# Patient Record
Sex: Female | Born: 1955
Health system: Southern US, Community
[De-identification: ages and names within clinical notes are randomized; demographics above are authoritative.]

## PROBLEM LIST (undated history)

## (undated) DIAGNOSIS — T7840XA Allergy, unspecified, initial encounter: Secondary | ICD-10-CM

## (undated) DIAGNOSIS — M7500 Adhesive capsulitis of unspecified shoulder: Secondary | ICD-10-CM

## (undated) DIAGNOSIS — N281 Cyst of kidney, acquired: Secondary | ICD-10-CM

## (undated) DIAGNOSIS — Z973 Presence of spectacles and contact lenses: Secondary | ICD-10-CM

## (undated) DIAGNOSIS — M8580 Other specified disorders of bone density and structure, unspecified site: Secondary | ICD-10-CM

## (undated) DIAGNOSIS — N201 Calculus of ureter: Secondary | ICD-10-CM

## (undated) DIAGNOSIS — Q248 Other specified congenital malformations of heart: Secondary | ICD-10-CM

## (undated) DIAGNOSIS — M1990 Unspecified osteoarthritis, unspecified site: Secondary | ICD-10-CM

## (undated) DIAGNOSIS — R350 Frequency of micturition: Secondary | ICD-10-CM

## (undated) HISTORY — DX: Allergy, unspecified, initial encounter: T78.40XA

## (undated) HISTORY — DX: Adhesive capsulitis of unspecified shoulder: M75.00

## (undated) HISTORY — PX: CYSTECTOMY: SHX5119

## (undated) HISTORY — DX: Other specified disorders of bone density and structure, unspecified site: M85.80

## (undated) HISTORY — PX: COLONOSCOPY: SHX174

---

## 1999-02-04 ENCOUNTER — Other Ambulatory Visit: Admission: RE | Admit: 1999-02-04 | Discharge: 1999-02-04 | Payer: Self-pay | Admitting: Obstetrics and Gynecology

## 1999-12-17 ENCOUNTER — Ambulatory Visit (HOSPITAL_COMMUNITY): Admission: RE | Admit: 1999-12-17 | Discharge: 1999-12-17 | Payer: Self-pay | Admitting: Family Medicine

## 1999-12-17 ENCOUNTER — Encounter: Payer: Self-pay | Admitting: Family Medicine

## 2000-08-23 ENCOUNTER — Encounter: Admission: RE | Admit: 2000-08-23 | Discharge: 2000-08-23 | Payer: Self-pay

## 2001-04-13 ENCOUNTER — Ambulatory Visit: Admission: RE | Admit: 2001-04-13 | Discharge: 2001-04-13 | Payer: Self-pay | Admitting: Family Medicine

## 2001-04-13 ENCOUNTER — Encounter: Payer: Self-pay | Admitting: Family Medicine

## 2001-08-24 ENCOUNTER — Encounter: Admission: RE | Admit: 2001-08-24 | Discharge: 2001-08-24 | Payer: Self-pay | Admitting: Family Medicine

## 2001-08-24 ENCOUNTER — Encounter: Payer: Self-pay | Admitting: Family Medicine

## 2001-11-02 ENCOUNTER — Other Ambulatory Visit: Admission: RE | Admit: 2001-11-02 | Discharge: 2001-11-02 | Payer: Self-pay | Admitting: Obstetrics and Gynecology

## 2002-11-03 ENCOUNTER — Other Ambulatory Visit: Admission: RE | Admit: 2002-11-03 | Discharge: 2002-11-03 | Payer: Self-pay | Admitting: Obstetrics and Gynecology

## 2002-11-15 ENCOUNTER — Encounter: Payer: Self-pay | Admitting: Family Medicine

## 2002-11-15 ENCOUNTER — Encounter: Admission: RE | Admit: 2002-11-15 | Discharge: 2002-11-15 | Payer: Self-pay | Admitting: Family Medicine

## 2003-11-05 ENCOUNTER — Other Ambulatory Visit: Admission: RE | Admit: 2003-11-05 | Discharge: 2003-11-05 | Payer: Self-pay | Admitting: Obstetrics and Gynecology

## 2003-11-23 ENCOUNTER — Encounter: Admission: RE | Admit: 2003-11-23 | Discharge: 2003-11-23 | Payer: Self-pay | Admitting: Obstetrics and Gynecology

## 2004-11-05 ENCOUNTER — Other Ambulatory Visit: Admission: RE | Admit: 2004-11-05 | Discharge: 2004-11-05 | Payer: Self-pay | Admitting: *Deleted

## 2005-11-11 ENCOUNTER — Other Ambulatory Visit: Admission: RE | Admit: 2005-11-11 | Discharge: 2005-11-11 | Payer: Self-pay | Admitting: Obstetrics and Gynecology

## 2005-11-16 HISTORY — PX: CYSTECTOMY: SHX5119

## 2006-07-17 HISTORY — PX: CHOLECYSTECTOMY: SHX55

## 2006-08-12 ENCOUNTER — Encounter (INDEPENDENT_AMBULATORY_CARE_PROVIDER_SITE_OTHER): Payer: Self-pay | Admitting: Specialist

## 2006-08-12 ENCOUNTER — Ambulatory Visit (HOSPITAL_COMMUNITY): Admission: RE | Admit: 2006-08-12 | Discharge: 2006-08-13 | Payer: Self-pay | Admitting: Surgery

## 2006-08-12 HISTORY — PX: CHOLECYSTECTOMY: SHX55

## 2006-11-15 ENCOUNTER — Other Ambulatory Visit: Admission: RE | Admit: 2006-11-15 | Discharge: 2006-11-15 | Payer: Self-pay | Admitting: Obstetrics & Gynecology

## 2006-11-17 ENCOUNTER — Ambulatory Visit (HOSPITAL_COMMUNITY): Admission: RE | Admit: 2006-11-17 | Discharge: 2006-11-17 | Payer: Self-pay | Admitting: Obstetrics & Gynecology

## 2007-12-02 ENCOUNTER — Other Ambulatory Visit: Admission: RE | Admit: 2007-12-02 | Discharge: 2007-12-02 | Payer: Self-pay | Admitting: Obstetrics & Gynecology

## 2007-12-13 ENCOUNTER — Ambulatory Visit (HOSPITAL_COMMUNITY): Admission: RE | Admit: 2007-12-13 | Discharge: 2007-12-13 | Payer: Self-pay | Admitting: Obstetrics & Gynecology

## 2008-12-06 ENCOUNTER — Other Ambulatory Visit: Admission: RE | Admit: 2008-12-06 | Discharge: 2008-12-06 | Payer: Self-pay | Admitting: Obstetrics & Gynecology

## 2009-10-16 ENCOUNTER — Ambulatory Visit (HOSPITAL_COMMUNITY): Admission: RE | Admit: 2009-10-16 | Discharge: 2009-10-16 | Payer: Self-pay | Admitting: Obstetrics & Gynecology

## 2011-02-25 ENCOUNTER — Other Ambulatory Visit: Payer: Self-pay | Admitting: Obstetrics & Gynecology

## 2011-02-25 DIAGNOSIS — Z1231 Encounter for screening mammogram for malignant neoplasm of breast: Secondary | ICD-10-CM

## 2011-02-25 DIAGNOSIS — Z780 Asymptomatic menopausal state: Secondary | ICD-10-CM

## 2011-03-04 ENCOUNTER — Ambulatory Visit (HOSPITAL_COMMUNITY)
Admission: RE | Admit: 2011-03-04 | Discharge: 2011-03-04 | Disposition: A | Discharge status: Home / Self Care | Payer: PRIVATE HEALTH INSURANCE | Source: Ambulatory Visit | Attending: Obstetrics & Gynecology | Admitting: Obstetrics & Gynecology

## 2011-03-04 DIAGNOSIS — Z780 Asymptomatic menopausal state: Secondary | ICD-10-CM

## 2011-03-04 DIAGNOSIS — Z1231 Encounter for screening mammogram for malignant neoplasm of breast: Secondary | ICD-10-CM

## 2011-04-03 NOTE — Op Note (Signed)
NAMERAKIYA, KRAWCZYK                ACCOUNT NO.:  [id number]   MEDICAL RECORD NO.:  [id number]          PATIENT TYPE:  OIB   LOCATION:  1618                         FACILITY:  [facility]   PHYSICIAN:  Velora Heckler, MD      DATE OF BIRTH:  [birth year]   DATE OF PROCEDURE:  08/12/2006  DATE OF DISCHARGE:                                 OPERATIVE REPORT   PREOPERATIVE DIAGNOSIS:  Symptomatic cholelithiasis.   POSTOPERATIVE DIAGNOSIS:  Symptomatic cholelithiasis.   PROCEDURE:  Laparoscopic cholecystectomy, with intraoperative  cholangiography.   SURGEON:  Velora Heckler, M.D., FACS   ASSISTANT:  Adolph Pollack, M.D., FACS   ANESTHESIA:  General per Dr. Ronelle Nigh.   ESTIMATED BLOOD LOSS:  Minimal.   PREPARATION:  Betadine.   COMPLICATIONS:  None.   INDICATIONS:  The patient is a 55 year old white female from [place],  [place].  The patient presented with episodes of intermittent  epigastric abdominal pain radiating to the back for several years.  These  have become more frequent as of June2007.  The patient presented to her  primary physician and underwent ultrasound on June 11, 2006.  This showed a  contracted, stone-filled gallbladder.  The patient now comes to surgery for  cholecystectomy.   BODY OF REPORT:  Procedure was done in O.R. #11 at the [facility].  The patient is brought to the operating room and placed in a  supine position on the operating room table.  Following administration of  general anesthesia, the patient is prepped and draped in the usual strict  aseptic fashion.  After ascertaining that an adequate level of anesthesia  had been obtained, an infraumbilical incision is made in the midline with a  #15 blade.  Dissection is carried down to the fascia.  Fascia is incised in  the midline.  The peritoneal cavity is entered cautiously.  A 0 Vicryl  pursestring suture is placed on the fascia.  A Hasson cannula is introduced  and secured with a pursestring suture.  The abdomen is insufflated with  carbon dioxide.  Laparoscope is introduced and the abdomen explored.  Operative ports are placed along the right costal margin in the midline,  midclavicular line, and anterior axillary line.  Fundus of the gallbladder  was grasped and retracted cephalad.  Gallbladder is completely filled with  stones.  There appears to be a little bile within the gallbladder.  The neck  of the gallbladder is exposed.  Peritoneum is incised.  Cystic duct is  dissected out along its length.  Clip is placed at the neck of the  gallbladder.  Cystic duct is incised with the scissors.  Clear yellow bile  emanates from the cystic duct.  A Cook cholangiography catheter is  introduced through a stab wound in the right upper quadrant and advanced  into the cystic duct.  It is secured with a Ligaclip.  Using C-arm  fluoroscopy, real time cholangiography is performed.  There is rapid filling  of a normal-caliber common bile duct.  There is reflux of contrast into the  right and left hepatic ductal systems.  There is free flow distally, without  filling defect or obstruction.  Clip is withdrawn, and Cook catheter is  removed.  Cystic duct is triply clipped and divided.  Cystic artery is  dissected out, doubly clipped, and divided.  Posterior branch of the cystic  artery is doubly clipped and divided.  Gallbladder is then excised the  gallbladder bed using a hook electrocautery for hemostasis.  Gallbladder is  completely excised and extracted through the umbilical port.  It contains  multiple gallstones.  It is submitted to pathology for review.   Umbilical port is reinserted and secured with a pursestring.  Pneumoperitoneum is maintained.  Right upper quadrant is copiously irrigated  with warm saline, which is evacuated.  Hemostasis is obtained in the  gallbladder bed, and a small sheet of Surgicel is placed in the hilum of the  liver for  complete hemostasis.   At this point Dr. Leda Quail came to the operating room to proceed with  her portion of the procedure.  All ports are left in place for her use and  maintain pneumoperitoneum.  Wounds will be closed by Dr. Hyacinth Meeker at the  conclusion of her procedure which will be dictated under a separate  operative report.  The patient tolerated this first portion of the operation  without difficulty.      Velora Heckler, MD  Electronically Signed     TMG/MEDQ  D:  08/12/2006  T:  08/13/2006  Job:  [id number]   cc:   Leda Quail, M.D.   Tally Joe, M.D.  Fax: [phone]   Holley Bouche, M.D.  Fax: [phone]

## 2011-04-03 NOTE — Op Note (Signed)
NAMELEIYAH, MAULTSBY                ACCOUNT NO.:  [id number]   MEDICAL RECORD NO.:  [id number]          PATIENT TYPE:  OIB   LOCATION:  1618                         FACILITY:  [facility]   PHYSICIAN:  M. Leda Quail, MD  DATE OF BIRTH:  [birth year]   DATE OF PROCEDURE:  08/12/2006  DATE OF DISCHARGE:                                 OPERATIVE REPORT   PREOPERATIVE DIAGNOSIS:  A 55 year old, white female with history of ovarian  cyst and an enlarging left hydrosalpinx.  This has been followed  conservatively and now is about 9 cm.   POSTOPERATIVE DIAGNOSES:  1. Left paratubal cyst.  2. Adhesions to the right fallopian tube.  3. Right ovarian cyst.   PROCEDURES:  1. Left salpingectomy.  2. Lysis of adhesions.  3. Right cyst aspiration.   SURGEON:  M. Leda Quail, MD   ASSISTANT:  Edwena Felty. Romine, M.D.   ANESTHESIA:  General endotracheal.   SPECIMENS:  The left fallopian tube, right paratubal cyst and right ovarian  cyst fluid was aspirated and sent to pathology.   ESTIMATED BLOOD LOSS:  Minimal.   URINE OUTPUT:  600 mL.   FLUIDS:  1700 mL of LR.   INDICATIONS:  Ms. Viramontes is a very pleasant, 54 year old, white female who  has a history of an enlarging left side hydrosalpinx.  Last ultrasound was  about 9 cm.  She has been followed conservatively due to history of ovarian  cyst, but this apparent hydrosalpinx on ultrasound continues to enlarge and  at this point I am uncomfortable watching it any further.  I recommended  that we go ahead and proceed with laparoscopy.  The patient has also had  issues with gallstones and a combined surgery with Dr. Gerrit Friends was planned.  He initiated procedure and she underwent a laparoscopic cholecystectomy.  His operative report has been dictated.  Please see it for further details,  but once he was close to ending the procedure, I was made aware.   DESCRIPTION OF PROCEDURE:  When I entered the operating room, the patient  was in the  supine position with her legs in the low lithotomy position.  Foley catheter was in place.  The abdomen, perineum, inner thighs and vagina  had been prepped in a normal sterile fashion.  She still had four ports that  were in place after her cholecystectomy, three upper right quadrant ports  and one beneath the umbilicus.  The patient was brought down further on the  table initially.  Arms were positioned, then a speculum was placed in the  vagina.  The anterior cervix was visualized and single-tooth tenaculum was  used to grasp the anterior cervix and the acorn uterine manipulator was  advanced to the cervical os and attached to the tenaculum as a means of  manipulating the uterus during the case.  At this point, the surgeon was  sterilely gowned and gloved.   Attention was turned to the abdomen.  A pneumoperitoneum was already in  placed.  A laparoscope was placed intra-abdominally.  The patient was placed  in Trendelenburg.  Decision was  made to go ahead and place the right and  left lower quadrant ports.  Abdominal wall transilluminated to ensure  location of vasculature was noted.  Places for port sites were chosen and  1.5 mL of 0.50 percent Marcaine without epinephrine were instilled.  A 5 mL  skin incision was made with a knife on each side and then using a 5 mm,  nonbladed trocar port under direct visualization, these were inserted  through the abdominal layers into the pelvis.  The trocars were removed and  then blunt probe and smooth grasper were used to elevated the uterus  further.  Initially, a large, bluish-looking hydrosalpinx was noted.  With  further movement of bowel, this was actually noted to be a large paratubal  cyst.  Initially, it appeared to have blood in it, but further this was  discovered to be clear, straw-colored serous fluid.  There were some bowel  adhesions to the ovary and these were taken down sharply with Metzenbaum  scissors.  Then the paratubal cyst  was excised using endoscopic scissors  with bipolar cautery to achieve hemostasis.  This tubal cyst was brought out  through the left lower quadrant port as it decompressed after being incised  and this was handed off to be sent to pathology.  The decision was made to  go ahead and remove the left fallopian tube as there were adhesions to the  side wall and ovaries as well.  These lesions were freed up and then using  Clevenger forceps with bipolar cautery, the fallopian tube was cauterized  across at the level of the uterine ovarian pedicle.  Then using endoscopic  scissors, it was excised.  There was a small amount of bleeding noted at the  most inferior aspect of the fallopian tube and the Clevenger forceps were  used to obtain excellent hemostasis.  The Nezhat suction irrigator was used  to irrigate this pedicle and no bleeding was noted.  There were some further  adhesions of the bowel to the ovary and these were freed all the way down to  the base of the ovary.  At this point, the bowel was free from the ovary.  The ovary did have some adhesions to the pelvic side wall and these were  left.  The right tube was visualized and it appeared normal.  The right  ovary was visualized and there was a large, simple appearing cyst noted.  An  endoscopic cyst aspirator was then placed through the right lower quadrant  port and the cyst was aspirated and completely decompressed.  About 23 mL of  serous fluid was obtained from this and this was sent for cytology.  At this  point, the pelvis was inspected.  There was excellent hemostasis.  The  decision to end the procedure was made.  The right and left lower quadrant  ports were removed under direct visualization and the upper quadrant ports  were removed under direct visualization.  No bleeding was noted.  The gas was discontinued and the laparoscope was removed.  The pneumoperitoneum was  relieved.  The midline Hasson was removed.  A pursestring  had been placed in  the fascia.  The operator's index finger was placed in incision to ensure no  bowel was in the incision and then pursestring suture was tied closely to  close fascia at the midline.  All the incisions were cleansed.  The right  and left lower quadrant incisions were closed with Dermabond.  The  additional incisions were closed with subcuticular stitches of 4-0 Vicryl.  The incisions were cleansed with benzoin and Steri-Strips were applied and  Band-Aids were applied to the inferior incisions.   The instruments were removed from the vagina.  There was no bleeding from  the tenaculum sites on the cervix.  Sponge, needle and instrument counts  were correct x2 for the entire procedure including Dr. Ardine Eng procedure.  The patient was awakened from anesthesia and extubated without difficulty  and taken to the recovery room.  The  nursing staff did turn off the video before printing pictures taken during  the surgery which included the left paratubal cyst what the left ovary  looked like after the surgery was completed of the right ovary as well, so  we do not have any photo documentation for this case.      Lum Keas, MD  Electronically Signed     MSM/MEDQ  D:  08/12/2006  T:  08/13/2006  Job:  [id number]

## 2012-05-02 ENCOUNTER — Other Ambulatory Visit: Payer: Self-pay | Admitting: Family Medicine

## 2012-05-02 DIAGNOSIS — M542 Cervicalgia: Secondary | ICD-10-CM

## 2012-05-02 DIAGNOSIS — M25511 Pain in right shoulder: Secondary | ICD-10-CM

## 2012-05-08 ENCOUNTER — Inpatient Hospital Stay: Admission: RE | Admit: 2012-05-08 | Payer: PRIVATE HEALTH INSURANCE | Source: Ambulatory Visit

## 2012-05-08 ENCOUNTER — Other Ambulatory Visit: Payer: PRIVATE HEALTH INSURANCE

## 2013-04-05 ENCOUNTER — Ambulatory Visit: Payer: Self-pay | Admitting: Obstetrics & Gynecology

## 2013-04-06 ENCOUNTER — Other Ambulatory Visit: Payer: Self-pay | Admitting: Obstetrics & Gynecology

## 2013-04-06 DIAGNOSIS — Z1231 Encounter for screening mammogram for malignant neoplasm of breast: Secondary | ICD-10-CM

## 2013-04-07 ENCOUNTER — Ambulatory Visit (HOSPITAL_COMMUNITY)
Admission: RE | Admit: 2013-04-07 | Discharge: 2013-04-07 | Disposition: A | Discharge status: Home / Self Care | Payer: No Typology Code available for payment source | Source: Ambulatory Visit | Attending: Obstetrics & Gynecology | Admitting: Obstetrics & Gynecology

## 2013-04-07 ENCOUNTER — Ambulatory Visit: Payer: Self-pay | Admitting: Obstetrics & Gynecology

## 2013-04-07 DIAGNOSIS — Z1231 Encounter for screening mammogram for malignant neoplasm of breast: Secondary | ICD-10-CM | POA: Insufficient documentation

## 2013-04-12 ENCOUNTER — Encounter: Payer: Self-pay | Admitting: Obstetrics & Gynecology

## 2013-04-13 ENCOUNTER — Encounter: Payer: Self-pay | Admitting: Obstetrics & Gynecology

## 2013-04-13 ENCOUNTER — Ambulatory Visit (INDEPENDENT_AMBULATORY_CARE_PROVIDER_SITE_OTHER): Payer: No Typology Code available for payment source | Admitting: Obstetrics & Gynecology

## 2013-04-13 VITALS — BP 124/84 | Ht 62.75 in | Wt 206.0 lb

## 2013-04-13 DIAGNOSIS — N950 Postmenopausal bleeding: Secondary | ICD-10-CM

## 2013-04-13 DIAGNOSIS — Z01419 Encounter for gynecological examination (general) (routine) without abnormal findings: Secondary | ICD-10-CM

## 2013-04-13 DIAGNOSIS — Z0000 Encounter for general adult medical examination without abnormal findings: Secondary | ICD-10-CM

## 2013-04-13 LAB — COMPREHENSIVE METABOLIC PANEL
ALT: 23 U/L (ref 0–35)
AST: 17 U/L (ref 0–37)
Albumin: 4.2 g/dL (ref 3.5–5.2)
BUN: 15 mg/dL (ref 6–23)
Calcium: 9.3 mg/dL (ref 8.4–10.5)
Chloride: 103 mEq/L (ref 96–112)
Potassium: 4.3 mEq/L (ref 3.5–5.3)
Total Protein: 7.2 g/dL (ref 6.0–8.3)

## 2013-04-13 LAB — LIPID PANEL
Cholesterol: 196 mg/dL (ref 0–200)
LDL Cholesterol: 120 mg/dL — ABNORMAL HIGH (ref 0–99)
VLDL: 24 mg/dL (ref 0–40)

## 2013-04-13 LAB — POCT URINALYSIS DIPSTICK
Bilirubin, UA: NEGATIVE
Blood, UA: NEGATIVE
Ketones, UA: NEGATIVE
pH, UA: 5

## 2013-04-13 NOTE — Progress Notes (Addendum)
57 y.o. G2P2 MarriedCaucasianF here for annual exam.  Had some pelvic pain with pressure and then had a little spotting.  Pain lasted a couple of days.  Did spot one time last year as well.   Has given up meat and caffeine.     Patient's last menstrual period was 11/17/2007.          Sexually active: no  The current method of family planning is none.    Exercising: no  not regularly Smoker:  no  Health Maintenance: Pap:  03/10/12 WNL/negative HR HPV History of abnormal Pap:  no MMG:  04/07/13 normal  Colonoscopy:  3/07 repeat 5 years.  Kelly Nix confirmed with [facility], pt is overdue.   BMD:   03/04/11, worse t score -1.8 TDaP:  unsure Screening Labs: today, Hb today: 14.1, Urine today: WBC-+1   reports that she has never smoked. She has never used smokeless tobacco. She reports that she does not drink alcohol or use illicit drugs.  Past Medical History  Diagnosis Date  . Frozen shoulder left    did PT    Past Surgical History  Procedure Laterality Date  . Cholecystectomy  07/2006  . Cystectomy Left     paratubal cyst removal    Current Outpatient Prescriptions  Medication Sig Dispense Refill  . ibuprofen (ADVIL,MOTRIN) 200 MG tablet Take 200 mg by mouth every 6 (six) hours as needed for pain.       No current facility-administered medications for this visit.    Family History  Problem Relation Age of Onset  . Heart attack Brother 63  . Heart attack Brother     12 heart attacks and open heart surgery  . Diabetes Brother   . Heart attack Mother     heart surgery/staph infection  . Heart attack Sister     open heart surgery  . Congestive Heart Failure Father   . Stroke Father   . Hypertension Father     ROS:  Pertinent items are noted in HPI.  Otherwise, a comprehensive ROS was negative.  Exam:   BP 124/84  Ht 5' 2.75" (1.594 m)  Wt 206 lb (93.441 kg)  BMI 36.78 kg/m2  LMP 11/17/2007  Weight change: -5lbs   Height: 5' 2.75" (159.4 cm)  Ht Readings from Last 3  Encounters:  04/13/13 5' 2.75" (1.594 m)    General appearance: alert, cooperative and appears stated age Head: Normocephalic, without obvious abnormality, atraumatic Neck: no adenopathy, supple, symmetrical, trachea midline and thyroid normal to inspection and palpation Lungs: clear to auscultation bilaterally Breasts: normal appearance, no masses or tenderness Heart: regular rate and rhythm Abdomen: soft, non-tender; bowel sounds normal; no masses,  no organomegaly Extremities: extremities normal, atraumatic, no cyanosis or edema Skin: Skin color, texture, turgor normal. No rashes or lesions Lymph nodes: Cervical, supraclavicular, and axillary nodes normal. No abnormal inguinal nodes palpated Neurologic: Grossly normal   Pelvic: External genitalia:  no lesions              Urethra:  normal appearing urethra with no masses, tenderness or lesions              Bartholins and Skenes: normal                 Vagina: normal appearing vagina with normal color and discharge, no lesions              Cervix: no lesions  Pap taken: no Bimanual Exam:  Uterus:  normal size, contour, position, consistency, mobility, non-tender              Adnexa: normal adnexa and no mass, fullness, tenderness               Rectovaginal: Confirms               Anus:  normal sphincter tone, no lesions  A:  Well Woman with normal exam PMP, No HRT Pendulous breasts.  We have discussed reduction many times.  SUI Strong family hx of CVD (3 siblings, one parent) PMP bleeding and pelvic pain  P:   Mammogram yearly.  DW pt 3D MMG No pap smear today.  Pap and HR HPV negative 5/13. Knows we are not sure about Tdap.  She knows to call if she has any exposure.  Declines Tdap today. Return for PUS and possible endometrial biopsy. Will check with Dr. Randa Evens about next colonoscopy return annually or prn  An After Visit Summary was printed and given to the patient.

## 2013-04-13 NOTE — Patient Instructions (Signed)

## 2013-04-14 ENCOUNTER — Telehealth: Payer: Self-pay

## 2013-04-14 NOTE — Telephone Encounter (Signed)
Message copied by Elisha Headland on Fri Apr 14, 2013  1:15 PM ------      Message from: Jerene Bears      Created: Fri Apr 14, 2013  8:25 AM       Please inform labs look good except Vit D a little low.  Needs to be taking 1000IU/day.  Is 25. Goal >40.  CMP good.  TSH nl.  Lipids showed LDL 120 but all other values normal.  Will repeat 2-3 yrs. ------

## 2013-04-14 NOTE — Telephone Encounter (Signed)
5/30 lmtcb/kn 

## 2013-04-17 ENCOUNTER — Telehealth: Payer: Self-pay

## 2013-04-17 NOTE — Telephone Encounter (Signed)
Informed patient that colonoscopy is due

## 2013-04-17 NOTE — Telephone Encounter (Signed)
6/2 lmtcb//kn

## 2013-04-17 NOTE — Telephone Encounter (Signed)
Patient aware colonoscopy is overdue. Will call Dr Randa Evens with [facility] to schedule appointment.

## 2013-04-17 NOTE — Telephone Encounter (Signed)
Patient notified of all lab results.

## 2013-04-17 NOTE — Telephone Encounter (Signed)
Returning call.

## 2013-04-19 ENCOUNTER — Ambulatory Visit: Payer: Self-pay | Admitting: Obstetrics & Gynecology

## 2013-05-05 ENCOUNTER — Telehealth: Payer: Self-pay | Admitting: Obstetrics & Gynecology

## 2013-05-05 NOTE — Telephone Encounter (Signed)
Call to patient, LMTCB to discuss insurance information and schedule PUS.

## 2013-05-11 NOTE — Telephone Encounter (Signed)
LMTCB to discuss moving forward with PUS. Spoke with patient on 6/20 she states that she wanted to check with her insurance before scheduling since her husband lost his job their insurance would be terminated.

## 2013-06-12 ENCOUNTER — Telehealth: Payer: Self-pay | Admitting: Obstetrics & Gynecology

## 2013-06-12 NOTE — Telephone Encounter (Signed)
LMTCB to follow up with patient in regards to PUS that Dr. Hyacinth Meeker ordered. Spoke with patient on 6/20 and was informed that her husband lost his job and insurance benefits. LVM advising that we would like to follow up with her and refer her to the clinic if necessary.

## 2013-06-21 NOTE — Telephone Encounter (Signed)
LMTCB to follow up with patient in regards to the PUS that Dr. Hyacinth Meeker ordered.   Message to Dr. Hyacinth Meeker.

## 2013-06-22 ENCOUNTER — Encounter: Payer: Self-pay | Admitting: Obstetrics & Gynecology

## 2013-09-21 ENCOUNTER — Other Ambulatory Visit: Payer: Self-pay

## 2014-05-10 ENCOUNTER — Encounter: Payer: Self-pay | Admitting: Obstetrics & Gynecology

## 2014-05-10 ENCOUNTER — Ambulatory Visit (INDEPENDENT_AMBULATORY_CARE_PROVIDER_SITE_OTHER): Payer: 59 | Admitting: Obstetrics & Gynecology

## 2014-05-10 VITALS — BP 124/82 | HR 68 | Resp 16 | Ht 62.5 in | Wt 205.2 lb

## 2014-05-10 DIAGNOSIS — Z0000 Encounter for general adult medical examination without abnormal findings: Secondary | ICD-10-CM

## 2014-05-10 DIAGNOSIS — Z1211 Encounter for screening for malignant neoplasm of colon: Secondary | ICD-10-CM

## 2014-05-10 DIAGNOSIS — Z124 Encounter for screening for malignant neoplasm of cervix: Secondary | ICD-10-CM

## 2014-05-10 DIAGNOSIS — Z01419 Encounter for gynecological examination (general) (routine) without abnormal findings: Secondary | ICD-10-CM

## 2014-05-10 LAB — POCT URINALYSIS DIPSTICK
BILIRUBIN UA: NEGATIVE
Glucose, UA: NEGATIVE
KETONES UA: NEGATIVE
Nitrite, UA: NEGATIVE
PH UA: 5
RBC UA: NEGATIVE
Urobilinogen, UA: NEGATIVE

## 2014-05-10 LAB — COMPREHENSIVE METABOLIC PANEL
ALBUMIN: 4.5 g/dL (ref 3.5–5.2)
ALT: 24 U/L (ref 0–35)
AST: 18 U/L (ref 0–37)
Alkaline Phosphatase: 80 U/L (ref 39–117)
BUN: 16 mg/dL (ref 6–23)
CALCIUM: 9.6 mg/dL (ref 8.4–10.5)
CHLORIDE: 104 meq/L (ref 96–112)
CO2: 28 meq/L (ref 19–32)
CREATININE: 0.63 mg/dL (ref 0.50–1.10)
GLUCOSE: 98 mg/dL (ref 70–99)
POTASSIUM: 4.9 meq/L (ref 3.5–5.3)
Sodium: 139 mEq/L (ref 135–145)
Total Bilirubin: 0.7 mg/dL (ref 0.2–1.2)
Total Protein: 7.4 g/dL (ref 6.0–8.3)

## 2014-05-10 LAB — LIPID PANEL
CHOLESTEROL: 194 mg/dL (ref 0–200)
HDL: 61 mg/dL (ref >39)
LDL Cholesterol: 109 mg/dL — ABNORMAL HIGH (ref 0–99)
TRIGLYCERIDES: 118 mg/dL (ref <150)
Total CHOL/HDL Ratio: 3.2 Ratio
VLDL: 24 mg/dL (ref 0–40)

## 2014-05-10 LAB — HEMOGLOBIN, FINGERSTICK: Hemoglobin, fingerstick: 14 g/dL (ref 12.0–16.0)

## 2014-05-10 NOTE — Progress Notes (Signed)
58 y.o. G2P2 MarriedCaucasianF here for annual exam.  Going to [employer].   Then going to [place] for a couple of days.  She has gone several years with her daughters.  Husband is going this year as well.   No vaginal bleeding in over a year.    Needs colonoscopy referral.  Having a little more issues with voiding.  Feels she sometimes has to void twice to feel like she really empties her bladder.    Patient's last menstrual period was 11/16/2012.          Sexually active: no  The current method of family planning is none.    Exercising: no  not regularly Smoker:  Former smoker at age [age]  Health Maintenance: Pap:  03/10/12 WNL/negative HR HPV History of abnormal Pap:  no MMG:  04/07/13-normal, pt aware due Colonoscopy:  3/07-repeat in 5 years BMD:   03/04/11-osteopenia TDaP:  ? [facility] [place]: today, Hb today: 14.0, Urine today: WBC-1+, PROTEIN-trace   reports that she has quit smoking. She has never used smokeless tobacco. She reports that she does not drink alcohol or use illicit drugs.  Past Medical History  Diagnosis Date  . Frozen shoulder left    did PT    Past Surgical History  Procedure Laterality Date  . Cholecystectomy  07/2006  . Cystectomy Left     paratubal cyst removal    Current Outpatient Prescriptions  Medication Sig Dispense Refill  . ibuprofen (ADVIL,MOTRIN) 200 MG tablet Take 200 mg by mouth every 6 (six) hours as needed for pain.       No current facility-administered medications for this visit.    Family History  Problem Relation Age of Onset  . Heart attack Brother 54  . Heart attack Brother     12 heart attacks and open heart surgery  . Diabetes Brother   . Heart attack Mother     heart surgery/staph infection  . Heart attack Sister     open heart surgery  . Congestive Heart Failure Father   . Stroke Father   . Hypertension Father     ROS:  Pertinent items are noted in HPI.  Otherwise, a comprehensive ROS was  negative.  Exam:   BP 124/82  Pulse 68  Resp 16  Ht 5' 2.5" (1.588 m)  Wt 205 lb 3.2 oz (93.078 kg)  BMI 36.91 kg/m2  LMP 11/16/2012  Weight change: -1#  Height: 5' 2.5" (158.8 cm)  Ht Readings from Last 3 Encounters:  05/10/14 5' 2.5" (1.588 m)  04/13/13 5' 2.75" (1.594 m)    General appearance: alert, cooperative and appears stated age Head: Normocephalic, without obvious abnormality, atraumatic Neck: no adenopathy, supple, symmetrical, trachea midline and thyroid normal to inspection and palpation Lungs: clear to auscultation bilaterally Breasts: normal appearance, no masses or tenderness Heart: regular rate and rhythm Abdomen: soft, non-tender; bowel sounds normal; no masses,  no organomegaly Extremities: extremities normal, atraumatic, no cyanosis or edema Skin: Skin color, texture, turgor normal. No rashes or lesions Lymph nodes: Cervical, supraclavicular, and axillary nodes normal. No abnormal inguinal nodes palpated Neurologic: Grossly normal   Pelvic: External genitalia:  no lesions              Urethra:  normal appearing urethra with no masses, tenderness or lesions              Bartholins and Skenes: normal  Vagina: normal appearing vagina with normal color and discharge, no lesions, second degree cysotcele              Cervix: no lesions              Pap taken: yes Bimanual Exam:  Uterus:  normal size, contour, position, consistency, mobility, non-tender              Adnexa: normal adnexa and no mass, fullness, tenderness               Rectovaginal: Confirms               Anus:  normal sphincter tone, no lesions  A:  Well Woman with normal exam  PMP, No HRT  Pendulous breasts. We have discussed reduction many times.  SUI  Strong family hx of CVD (3 siblings, one parent)  PMP bleeding and pelvic pain  Cystocele on physical exam this year  P: Mammogram yearly. DW pt 3D MMG  No pap smear today. Pap and HR HPV negative 5/13.  Knows we are not  sure about Tdap. She knows to call if she has any exposure. Declines Tdap today.  Return for PUS and possible endometrial biopsy.  Will check with Dr. Oletta Lamas about next colonoscopy  return annually or prn  An After Visit Summary was printed and given to the patient.

## 2014-05-10 NOTE — Patient Instructions (Signed)

## 2014-05-11 LAB — IPS PAP SMEAR ONLY

## 2014-05-11 LAB — TSH: TSH: 0.929 u[IU]/mL (ref 0.350–4.500)

## 2014-05-11 LAB — VITAMIN D 25 HYDROXY (VIT D DEFICIENCY, FRACTURES): Vit D, 25-Hydroxy: 27 ng/mL — ABNORMAL LOW (ref 30–89)

## 2014-05-21 ENCOUNTER — Telehealth: Payer: Self-pay | Admitting: Obstetrics & Gynecology

## 2014-05-21 NOTE — Telephone Encounter (Signed)
Left message for patient to call back. Need to advise that she needs pcp referral for colonoscopy

## 2014-05-21 NOTE — Telephone Encounter (Signed)
Pt is returning a call to [place]

## 2014-05-21 NOTE — Telephone Encounter (Signed)
Spoke with patient. Advised that she needs pcp referral for colonoscopy. She will contact pcp with this info

## 2014-05-21 NOTE — Telephone Encounter (Signed)
Patient is calling sabrina back

## 2014-05-21 NOTE — Telephone Encounter (Signed)
Left message for patient to call back  

## 2014-09-17 ENCOUNTER — Encounter: Payer: Self-pay | Admitting: Obstetrics & Gynecology

## 2015-06-21 ENCOUNTER — Encounter: Payer: Self-pay | Admitting: Obstetrics & Gynecology

## 2015-06-21 ENCOUNTER — Ambulatory Visit (INDEPENDENT_AMBULATORY_CARE_PROVIDER_SITE_OTHER): Payer: 59 | Admitting: Obstetrics & Gynecology

## 2015-06-21 ENCOUNTER — Other Ambulatory Visit: Payer: Self-pay | Admitting: Obstetrics & Gynecology

## 2015-06-21 VITALS — BP 120/76 | HR 68 | Resp 16 | Ht 62.5 in | Wt 204.0 lb

## 2015-06-21 DIAGNOSIS — Z124 Encounter for screening for malignant neoplasm of cervix: Secondary | ICD-10-CM

## 2015-06-21 DIAGNOSIS — N950 Postmenopausal bleeding: Secondary | ICD-10-CM | POA: Diagnosis not present

## 2015-06-21 DIAGNOSIS — Z1211 Encounter for screening for malignant neoplasm of colon: Secondary | ICD-10-CM

## 2015-06-21 DIAGNOSIS — Z01419 Encounter for gynecological examination (general) (routine) without abnormal findings: Secondary | ICD-10-CM

## 2015-06-21 DIAGNOSIS — Z0000 Encounter for general adult medical examination without abnormal findings: Secondary | ICD-10-CM

## 2015-06-21 DIAGNOSIS — Z1231 Encounter for screening mammogram for malignant neoplasm of breast: Secondary | ICD-10-CM

## 2015-06-21 LAB — POCT URINALYSIS DIPSTICK
Bilirubin, UA: NEGATIVE
Blood, UA: NEGATIVE
Glucose, UA: NEGATIVE
Ketones, UA: NEGATIVE
Nitrite, UA: NEGATIVE
Urobilinogen, UA: NEGATIVE
pH, UA: 5

## 2015-06-21 LAB — COMPREHENSIVE METABOLIC PANEL WITH GFR
ALT: 26 U/L (ref 6–29)
AST: 20 U/L (ref 10–35)
Albumin: 4.2 g/dL (ref 3.6–5.1)
Alkaline Phosphatase: 71 U/L (ref 33–130)
BUN: 18 mg/dL (ref 7–25)
CO2: 24 mmol/L (ref 20–31)
Calcium: 9.8 mg/dL (ref 8.6–10.4)
Chloride: 104 mmol/L (ref 98–110)
Creat: 0.72 mg/dL (ref 0.50–1.05)
Glucose, Bld: 98 mg/dL (ref 65–99)
Potassium: 4.4 mmol/L (ref 3.5–5.3)
Sodium: 138 mmol/L (ref 135–146)
Total Bilirubin: 0.6 mg/dL (ref 0.2–1.2)
Total Protein: 7.1 g/dL (ref 6.1–8.1)

## 2015-06-21 LAB — LIPID PANEL
CHOL/HDL RATIO: 3.5 ratio (ref <=5.0)
Cholesterol: 192 mg/dL (ref 125–200)
HDL: 55 mg/dL (ref >=46)
LDL Cholesterol: 109 mg/dL (ref <130)
TRIGLYCERIDES: 139 mg/dL (ref <150)
VLDL: 28 mg/dL (ref <30)

## 2015-06-21 LAB — HEMOGLOBIN, FINGERSTICK: HEMOGLOBIN, FINGERSTICK: 14 g/dL (ref 12.0–16.0)

## 2015-06-21 NOTE — Progress Notes (Addendum)
59 y.o. G2P2 MarriedCaucasianF here for annual exam.  Doing well.  Had had one episode of PMP bleeding since last year.  Did not return for follow up recommended.     PCP:  Dr. Kenton Kingfisher.  Hasn't seen him in several years.    Patient's last menstrual period was 11/16/2012.          Sexually active: No.  The current method of family planning is post menopausal status.    Exercising: No.  not regularly Smoker:  Former smoker at age [age]  Health Maintenance: Pap:  05/10/14 WNL, neg HR HPV 4/13 History of abnormal Pap:  no MMG:  04/07/13-normal Colonoscopy:  3/07-repeat in 5 years BMD:   03/04/11-osteopenia TDaP:  ? PCP Screening Labs: today, Hb today: 14.0, Urine today: WBC-trace, PROTEIN-trace   reports that she has quit smoking. She has never used smokeless tobacco. She reports that she does not drink alcohol or use illicit drugs.  Past Medical History  Diagnosis Date  . Frozen shoulder left    did PT  . Osteopenia     Past Surgical History  Procedure Laterality Date  . Cholecystectomy  07/2006  . Cystectomy Left     paratubal cyst removal    Current Outpatient Prescriptions  Medication Sig Dispense Refill  . ibuprofen (ADVIL,MOTRIN) 200 MG tablet Take 200 mg by mouth every 6 (six) hours as needed for pain.     No current facility-administered medications for this visit.    Family History  Problem Relation Age of Onset  . Heart attack Brother 14  . Heart attack Brother     12 heart attacks and open heart surgery  . Diabetes Brother   . Heart attack Mother     heart surgery/staph infection  . Heart attack Sister     open heart surgery  . Congestive Heart Failure Father   . Stroke Father   . Hypertension Father     ROS:  Pertinent items are noted in HPI.  Otherwise, a comprehensive ROS was negative.  Exam:   General appearance: alert, cooperative and appears stated age Head: Normocephalic, without obvious abnormality, atraumatic Neck: no adenopathy, supple,  symmetrical, trachea midline and thyroid normal to inspection and palpation Lungs: clear to auscultation bilaterally Breasts: normal appearance, no masses or tenderness, pendulous Heart: regular rate and rhythm Abdomen: soft, non-tender; bowel sounds normal; no masses,  no organomegaly Extremities: extremities normal, atraumatic, no cyanosis or edema Skin: Skin color, texture, turgor normal. No rashes or lesions Lymph nodes: Cervical, supraclavicular, and axillary nodes normal. No abnormal inguinal nodes palpated Neurologic: Grossly normal   Pelvic: External genitalia:  no lesions              Urethra:  normal appearing urethra with no masses, tenderness or lesions              Bartholins and Skenes: normal                 Vagina: normal appearing vagina with normal color and discharge, no lesions              Cervix: no lesions              Pap taken: No. Bimanual Exam:  Uterus:  uterus absent              Adnexa: normal adnexa and no mass, fullness, tenderness               Rectovaginal: Confirms  Anus:  normal sphincter tone, no lesions  Endometrial biopsy recommended.  Discussed with patient.  Verbal and written consent obtained.   Procedure:  Speculum placed.  Cervix visualized and cleansed with betadine prep.  A single toothed tenaculum was applied to the anterior lip of the cervix.  Endometrial pipelle was advanced through the cervix into the endometrial cavity without difficulty.  Pipelle passed to 7.5cm.  Suction applied and pipelle removed with good tissue sample obtained with two passes.  Tissue appears to be an endometrial polyp.  Tenculum removed.  No bleeding noted.  Patient tolerated procedure well.   Chaperone was present for exam.  A:  Well Woman with normal exam  PMP, No HRT  Pendulous breasts.  Considering referral. SUI  Strong family hx of CVD (3 siblings, one parent)  Cystocele PMP bleeding   P: Mammogram yearly. DW pt 3D MMG.  Will schedule  for pt today. Neg pap 2015.  Pap and HR HPV negative 5/13. Pap today. Knows we are not sure about Tdap. She knows to call if she has any exposure but declines Tdap today Declines colonoscopy.  Oc light given today. Endometrial biopsy pending. return annually or prn

## 2015-06-21 NOTE — Addendum Note (Signed)
Addended by: Megan Salon on: 06/21/2015 11:30 AM   Modules accepted: Miquel Dunn

## 2015-06-22 LAB — TSH: TSH: 1.051 u[IU]/mL (ref 0.350–4.500)

## 2015-06-24 LAB — IPS PAP TEST WITH REFLEX TO HPV

## 2015-06-26 ENCOUNTER — Telehealth: Payer: Self-pay | Admitting: Emergency Medicine

## 2015-06-26 NOTE — Telephone Encounter (Signed)
Call to patient. Does not have any numbers listed on designated party release form. Message left to return my call on home/mobile phone, only one number in chart contacts. Did not leave any details of results on message.

## 2015-06-26 NOTE — Telephone Encounter (Signed)
-----   Message from Megan Salon, MD sent at 06/25/2015  4:35 AM EDT ----- Inform pt her pap was normal.  08 recall.  Her endometrial biopsy showed "polypoid" proliferative endometrium.  No evidence of cancer was noted.  Pt needs to take Provera 10mg  x 10 days.  I think she will have bleeding after this.  Her body is producing a little estrogen and the provera will cause this to all shed she will have some bleeding after the 10 days.  Needs one month follow up with me after that time.

## 2015-06-27 NOTE — Telephone Encounter (Signed)
Patient returned call and message from Dr. Sabra Heck given. Patient states that she did have some vaginal bleeding on 8/8, 8/9,8/10 but no bleeding today. Felt pelvic cramping "like a cycle." Patient states she did not have any bleeding after Endometrial biopsy on 06/21/15.    Patient declines prescription for Provera 10 mg PO daily for 10 days. She states that "Dr. Sabra Heck knows how I feel about any medication. I will have to really think about this before I take anything." Discussed with patient reason for provera as since her body is still producing a small amount of estrogen, to avoid risk for endometrial hyperplasia with possible accumulation of endometrial lining. Patient states she will call back if would like prescription.   Follow up appointment is scheduled with Dr. Sabra Heck for 08/05/15 patient agreeable.

## 2015-06-27 NOTE — Telephone Encounter (Signed)
08 Recall placed

## 2015-06-27 NOTE — Telephone Encounter (Signed)
If she doesn't take the medication, she needs to have a hysteroscopy and D&C.  The pathology shows she is making some estrogen (proliferative).  If this isn't treated with the Provera or removed with a D&C, over time this can turn into endometrial cancer.  She needs to do something.  Probably should scheduled repeat OV to discuss.  Thanks.

## 2015-07-01 NOTE — Telephone Encounter (Signed)
Call to patient. Advised there are other options than progesterone. Could have office visit to discuss with dr Sabra Heck and determine best option for her. Patietn agreeable and appointment scheduled for 07-05-15 at 1115.  Encounter closed.

## 2015-07-01 NOTE — Telephone Encounter (Signed)
Returning a call to Sally. °

## 2015-07-01 NOTE — Telephone Encounter (Signed)
Call to patient, left message to call back. 

## 2015-07-04 ENCOUNTER — Ambulatory Visit (HOSPITAL_COMMUNITY)
Admission: RE | Admit: 2015-07-04 | Discharge: 2015-07-04 | Disposition: A | Discharge status: Home / Self Care | Payer: 59 | Source: Ambulatory Visit | Attending: Obstetrics & Gynecology | Admitting: Obstetrics & Gynecology

## 2015-07-04 DIAGNOSIS — Z1231 Encounter for screening mammogram for malignant neoplasm of breast: Secondary | ICD-10-CM | POA: Insufficient documentation

## 2015-07-05 ENCOUNTER — Encounter: Payer: Self-pay | Admitting: Obstetrics & Gynecology

## 2015-07-05 ENCOUNTER — Ambulatory Visit (INDEPENDENT_AMBULATORY_CARE_PROVIDER_SITE_OTHER): Payer: 59 | Admitting: Obstetrics & Gynecology

## 2015-07-05 VITALS — BP 118/82 | HR 96 | Resp 16 | Wt 204.0 lb

## 2015-07-05 DIAGNOSIS — N950 Postmenopausal bleeding: Secondary | ICD-10-CM | POA: Diagnosis not present

## 2015-07-05 NOTE — Progress Notes (Signed)
Patient ID: Evelyn Bright, female   DOB: 1956/08/24, 59 y.o.   MRN: [id number]  59 yo G2P2 MWF here for follow-up discussion of her endometrial biopsy results obtained 06/21/15 at her [facility] after having reported spotting for the last two years.    06/21/15 results:  Endometrium, biopsy POLYP TYPE PROLIFERATIVE ENDOMETRIUM NEGATIVE FOR DYSPLASIA OR MALIGNANCY   Pap was negative.  Advised pt to take Provera 10mg  x 10 days to help thin lining and decrease risks for future bleeding and/or transition to further endometrial changes.  Pt declined.  Felt we needed to discuss in person so she is here for this today.    Reviewed results with her.  Advised pathology showed estrogen exposure, probably due to increased estrone production from fatty tissue.  This shows some chronic estrogen exposure which is like taking un-upposed estrogen and, therefore, puts her at risk for endometrial hyperplasia, atypical cells, or even endometrial cancer.  Pt doesn't want medication due to possible side effects.  reveiwed these with her from side effects on Up To Date.  Most significant side effect is bleeding irregularities/changes which I absolutely expect.  Also, any side effects she has will be short lived due to 10 day course.  Would not need to repeat unless has future spotting and I would do an endometrial biopsy again before repeating the Provera.    Also, instead of Provera could do D&C to thin lining and get complete sampling of endometrium.  This would eliminate risks associated with progesterone.  Procedure, risks/benefits, recovery all discussed.  Pt just not sure she wants to do anything.  She is clearly aware that I am unsupportive of this decision.  I recognize this is her body and her choice to make but that I am also her provider and realize that she is putting herself as risk by choosing to do nothing.  Pt states she will really consider this and let me know next week her decision.  Assessment:  PMP  bleeding Obesity Proliferative endometrium on biopsy  Plan:  Provera challenge vs D&C.  Hopefully pt will choose one of these options.  Reminder placed to ensure pt calls back.  ~20 minutes spent with patient >50% of time was in face to face discussion of above.

## 2015-07-07 DIAGNOSIS — N950 Postmenopausal bleeding: Secondary | ICD-10-CM | POA: Insufficient documentation

## 2015-08-02 ENCOUNTER — Telehealth: Payer: Self-pay | Admitting: Obstetrics & Gynecology

## 2015-08-02 NOTE — Telephone Encounter (Signed)
Dr. Sabra Heck, can you review and advise how to proceed.  Patient was to take Provera or D&C.  Has not taken Provera after office visit with you.

## 2015-08-02 NOTE — Telephone Encounter (Signed)
Patient cancelled her fu visit with Dr Sabra Heck for Monday because she has not started the medication and not sure she wants to take it.

## 2015-08-05 ENCOUNTER — Ambulatory Visit: Payer: 59 | Admitting: Obstetrics & Gynecology

## 2015-08-05 NOTE — Telephone Encounter (Signed)
Noted message from Dr. Miller. Will close encounter.   

## 2015-08-05 NOTE — Telephone Encounter (Signed)
I disagree with her decision but clearly discussed this with her at her visit and clearly documented this.  She is aware of the risks of doing nothing.  Ok to close encounter.

## 2016-02-05 ENCOUNTER — Encounter: Payer: Self-pay | Admitting: Obstetrics & Gynecology

## 2016-09-22 ENCOUNTER — Encounter: Payer: Self-pay | Admitting: Obstetrics & Gynecology

## 2016-09-23 ENCOUNTER — Telehealth: Payer: Self-pay | Admitting: Obstetrics & Gynecology

## 2016-09-23 NOTE — Telephone Encounter (Signed)
Left message to call Arlander Gillen at [phone].  

## 2016-09-23 NOTE — Telephone Encounter (Signed)
Patient says she does not remember the medication that Dr Sabra Heck wanted her to take after her biopsy.

## 2016-09-25 ENCOUNTER — Ambulatory Visit: Payer: 59 | Admitting: Obstetrics & Gynecology

## 2016-09-30 NOTE — Telephone Encounter (Signed)
Left message to call Filippo Puls at [phone].  

## 2016-10-05 NOTE — Telephone Encounter (Signed)
Ok to close encounter. 

## 2016-10-05 NOTE — Telephone Encounter (Signed)
Dr.Miller, attempted to reach this patient x2 without return call. Okay to close encounter?

## 2016-12-24 NOTE — Progress Notes (Signed)
61 y.o. G2P2 MarriedCaucasianF here for annual exam.  Very sad today.  Husband just passed due to some heart issues.  Remer Macho was yesterday.  Pt reports he was retaining fluid.  Blood work was not normal.  Cardiologist recommended additional evaluation.  He had a blood clot around the heart and a valve issue.  He was in the CCU when the clot broke and he had a MI.  He died December 27, 2016.  She is starting to work part time.    Patient's last menstrual period was 11/16/2012.          Sexually active: No.  The current method of family planning is post menopausal status.    Exercising: No.  The patient does not participate in regular exercise at present. Smoker:  Former smoker   Health Maintenance: Pap: 06/21/15 negative, 05/10/14 negative  History of abnormal Pap:  no MMG:  07/04/15 BIRADS 1 negative.  Pt is aware this is due.   Colonoscopy:  3/07- repeat 5 years.  Pt declines at this time. BMD:   03/04/11 osteopenia  TDaP:  >10 years Pneumonia vaccine(s):  never Zostavax:   never Hep C testing: drawn today  Screening Labs: drawn today, Hb today: same, Urine today: normal    reports that she has quit smoking. She has never used smokeless tobacco. She reports that she does not drink alcohol or use drugs.  Past Medical History:  Diagnosis Date  . Frozen shoulder left   did PT  . Osteopenia     Past Surgical History:  Procedure Laterality Date  . CHOLECYSTECTOMY  07/2006  . CYSTECTOMY Left    paratubal cyst removal    Current Outpatient Prescriptions  Medication Sig Dispense Refill  . ibuprofen (ADVIL,MOTRIN) 200 MG tablet Take 200 mg by mouth every 6 (six) hours as needed for pain.     No current facility-administered medications for this visit.     Family History  Problem Relation Age of Onset  . Heart attack Brother 74  . Heart attack Brother     12 heart attacks and open heart surgery  . Diabetes Brother   . Heart attack Mother     heart surgery/staph infection  . Heart attack  Sister     open heart surgery  . Congestive Heart Failure Father   . Stroke Father   . Hypertension Father     ROS:  Pertinent items are noted in HPI.  Otherwise, a comprehensive ROS was negative.  Exam:   BP 114/70 (BP Location: Right Arm, Patient Position: Sitting, Cuff Size: Normal)   Pulse 78   Resp 14   Ht 5' 2.5" (1.588 m)   Wt 201 lb (91.2 kg)   LMP 11/16/2012   BMI 36.18 kg/m   Weight change: -3#   Height: 5' 2.5" (158.8 cm)  Ht Readings from Last 3 Encounters:  12/25/16 5' 2.5" (1.588 m)  06/21/15 5' 2.5" (1.588 m)  05/10/14 5' 2.5" (1.588 m)    General appearance: alert, cooperative and appears stated age Head: Normocephalic, without obvious abnormality, atraumatic Neck: no adenopathy, supple, symmetrical, trachea midline and thyroid normal to inspection and palpation Lungs: clear to auscultation bilaterally Breasts: normal appearance, no masses or tenderness Heart: regular rate and rhythm Abdomen: soft, non-tender; bowel sounds normal; no masses,  no organomegaly Extremities: extremities normal, atraumatic, no cyanosis or edema Skin: Skin color, texture, turgor normal. No rashes or lesions Lymph nodes: Cervical, supraclavicular, and axillary nodes normal. No abnormal inguinal nodes palpated Neurologic: Grossly  normal   Pelvic: External genitalia:  no lesions              Urethra:  normal appearing urethra with no masses, tenderness or lesions              Bartholins and Skenes: normal                 Vagina: normal appearing vagina with normal color and discharge, no lesions              Cervix: no lesions              Pap taken: Yes.   Bimanual Exam:  Uterus:  normal size, contour, position, consistency, mobility, non-tender              Adnexa: normal adnexa and no mass, fullness, tenderness               Rectovaginal: Confirms               Anus:  normal sphincter tone, no lesions  Chaperone was present for exam.  A:  Well Woman with normal exam PMP,  no HRT SUI Strong family hx of CVD (3 siblings, one parent) Cystocele Grief reaction  P:   Mammogram guidelines reviewed.  Pt aware this is due. pap smear and HR HPV obtained today CBC, Lipids, CMP, TSH, Vit D Hep C antibody obtained Declines colonoscopy.  Will investigate cologuard for him. Declines tetanus shot today return annually or prn

## 2016-12-25 ENCOUNTER — Encounter: Payer: Self-pay | Admitting: Obstetrics & Gynecology

## 2016-12-25 ENCOUNTER — Ambulatory Visit (INDEPENDENT_AMBULATORY_CARE_PROVIDER_SITE_OTHER): Payer: BLUE CROSS/BLUE SHIELD | Admitting: Obstetrics & Gynecology

## 2016-12-25 VITALS — BP 114/70 | HR 78 | Resp 14 | Ht 62.5 in | Wt 201.0 lb

## 2016-12-25 DIAGNOSIS — Z124 Encounter for screening for malignant neoplasm of cervix: Secondary | ICD-10-CM

## 2016-12-25 DIAGNOSIS — Z1151 Encounter for screening for human papillomavirus (HPV): Secondary | ICD-10-CM | POA: Diagnosis not present

## 2016-12-25 DIAGNOSIS — Z01419 Encounter for gynecological examination (general) (routine) without abnormal findings: Secondary | ICD-10-CM

## 2016-12-25 DIAGNOSIS — Z205 Contact with and (suspected) exposure to viral hepatitis: Secondary | ICD-10-CM | POA: Diagnosis not present

## 2016-12-25 DIAGNOSIS — Z0000 Encounter for general adult medical examination without abnormal findings: Secondary | ICD-10-CM | POA: Diagnosis not present

## 2016-12-25 LAB — COMPREHENSIVE METABOLIC PANEL
ALK PHOS: 83 U/L (ref 33–130)
ALT: 20 U/L (ref 6–29)
AST: 18 U/L (ref 10–35)
Albumin: 4.3 g/dL (ref 3.6–5.1)
BILIRUBIN TOTAL: 0.5 mg/dL (ref 0.2–1.2)
BUN: 16 mg/dL (ref 7–25)
CO2: 25 mmol/L (ref 20–31)
CREATININE: 0.69 mg/dL (ref 0.50–1.05)
Calcium: 10 mg/dL (ref 8.6–10.4)
Chloride: 106 mmol/L (ref 98–110)
GLUCOSE: 96 mg/dL (ref 65–99)
POTASSIUM: 5.1 mmol/L (ref 3.5–5.3)
Sodium: 141 mmol/L (ref 135–146)
TOTAL PROTEIN: 7.6 g/dL (ref 6.1–8.1)

## 2016-12-25 LAB — CBC
HCT: 42.1 % (ref 35.0–45.0)
Hemoglobin: 14.1 g/dL (ref 11.7–15.5)
MCH: 30 pg (ref 27.0–33.0)
MCHC: 33.5 g/dL (ref 32.0–36.0)
MCV: 89.6 fL (ref 80.0–100.0)
MPV: 10.3 fL (ref 7.5–12.5)
PLATELETS: 252 10*3/uL (ref 140–400)
RBC: 4.7 MIL/uL (ref 3.80–5.10)
RDW: 13.4 % (ref 11.0–15.0)
WBC: 9.8 10*3/uL (ref 3.8–10.8)

## 2016-12-25 LAB — POCT URINALYSIS DIPSTICK
BILIRUBIN UA: NEGATIVE
Blood, UA: NEGATIVE
GLUCOSE UA: NEGATIVE
Ketones, UA: NEGATIVE
LEUKOCYTES UA: NEGATIVE
NITRITE UA: NEGATIVE
PH UA: 5
Protein, UA: NEGATIVE
UROBILINOGEN UA: NEGATIVE

## 2016-12-25 LAB — LIPID PANEL
CHOL/HDL RATIO: 2.9 ratio (ref <5.0)
CHOLESTEROL: 178 mg/dL (ref <200)
HDL: 62 mg/dL (ref >50)
LDL Cholesterol: 95 mg/dL (ref <100)
Triglycerides: 103 mg/dL (ref <150)
VLDL: 21 mg/dL (ref <30)

## 2016-12-25 LAB — TSH: TSH: 0.6 m[IU]/L

## 2016-12-26 LAB — HEPATITIS C ANTIBODY: HCV AB: NEGATIVE

## 2016-12-26 LAB — VITAMIN D 25 HYDROXY (VIT D DEFICIENCY, FRACTURES): VIT D 25 HYDROXY: 24 ng/mL — AB (ref 30–100)

## 2016-12-29 ENCOUNTER — Telehealth: Payer: Self-pay

## 2016-12-29 LAB — IPS PAP TEST WITH HPV

## 2016-12-29 NOTE — Telephone Encounter (Signed)
Spoke with patient. Advised of all Cologuard information as seen below. Patient is agreeable. Cologuard form to the front desk for patient to come by the office to review and sign at her convenience.   Routing to provider for final review. Patient agreeable to disposition. Will close encounter.

## 2016-12-29 NOTE — Telephone Encounter (Signed)
Left message to call [place] at [phone].  Need to advise Cologuard request form has been completed with Dr.Miller. This form will need to be reviewed and signed by the patient. She may come by the office at her convenience. Form will be faxed to [employer] after completion. [employer] will contact the patient to review benefit coverage with her insurance before proceeding. She will need to notify the office once she decides to proceed or decline Cologuard based on benefit information. If she agrees Chief Strategy Officer will send her Cologuard in the mail and will explain instructions for use. She will send this back to the address provided. Once she has sent this back she will need to notify Dr.Miller so we are aware this has been completed. Dr.Miller will receive results and she will be contacted to discuss.

## 2017-01-15 ENCOUNTER — Other Ambulatory Visit: Payer: Self-pay | Admitting: Obstetrics & Gynecology

## 2017-01-15 DIAGNOSIS — Z1231 Encounter for screening mammogram for malignant neoplasm of breast: Secondary | ICD-10-CM

## 2017-01-26 ENCOUNTER — Ambulatory Visit: Payer: PRIVATE HEALTH INSURANCE

## 2017-01-27 ENCOUNTER — Ambulatory Visit: Payer: PRIVATE HEALTH INSURANCE

## 2017-02-11 ENCOUNTER — Ambulatory Visit
Admission: RE | Admit: 2017-02-11 | Discharge: 2017-02-11 | Disposition: A | Discharge status: Home / Self Care | Payer: BLUE CROSS/BLUE SHIELD | Source: Ambulatory Visit | Attending: Obstetrics & Gynecology | Admitting: Obstetrics & Gynecology

## 2017-02-11 DIAGNOSIS — Z1231 Encounter for screening mammogram for malignant neoplasm of breast: Secondary | ICD-10-CM

## 2018-03-17 ENCOUNTER — Telehealth: Payer: Self-pay | Admitting: Obstetrics & Gynecology

## 2018-03-17 DIAGNOSIS — R102 Pelvic and perineal pain: Secondary | ICD-10-CM

## 2018-03-17 NOTE — Telephone Encounter (Signed)
Spoke with patient. Patient reports intermittent, dull, bilateral pelvic pain "at left ovary". Has been ongoing for a year, increased over the last few weeks. Pain is more frequent on left side. Reports nausea and chills when pain occurred last, over this past weekend.   Patient denies bleeding, urinary complainants, vag d/cN/V, fever/chills currently. Denies any bowel changes.   Hx of cystectomy in 2007 Last AEX 12/25/16, next 03/29/18  Patient scheduled for PUS on 03/24/18 at 2pm with consult to follow at 2:30pm with Dr. Sabra Heck. Advised to return call to office with any new symptoms develop or if symptoms worsen. ER if after hours.  Advised Dr. Sabra Heck will review, I will return call with any additional recommendations.   Order placed for PUS.   Routing to provider for final review. Patient is agreeable to disposition. Will close encounter.  Cc: Lerry Liner

## 2018-03-17 NOTE — Telephone Encounter (Signed)
Left message to call Evelyn Bright at [phone].  

## 2018-03-17 NOTE — Telephone Encounter (Signed)
Patient is having left ovarian pain. °

## 2018-03-22 ENCOUNTER — Telehealth: Payer: Self-pay | Admitting: Obstetrics & Gynecology

## 2018-03-22 NOTE — Telephone Encounter (Signed)
Call placed to patient to convey benefits for appointment scheduled on 03/24/18. Left voicemail message requesting a return call

## 2018-03-24 ENCOUNTER — Ambulatory Visit: Payer: BLUE CROSS/BLUE SHIELD | Admitting: Obstetrics & Gynecology

## 2018-03-24 ENCOUNTER — Encounter: Payer: Self-pay | Admitting: Obstetrics & Gynecology

## 2018-03-24 ENCOUNTER — Ambulatory Visit (INDEPENDENT_AMBULATORY_CARE_PROVIDER_SITE_OTHER): Payer: BLUE CROSS/BLUE SHIELD

## 2018-03-24 VITALS — BP 126/84 | HR 96 | Resp 16 | Ht 62.5 in | Wt 183.0 lb

## 2018-03-24 DIAGNOSIS — N7011 Chronic salpingitis: Secondary | ICD-10-CM | POA: Diagnosis not present

## 2018-03-24 DIAGNOSIS — R102 Pelvic and perineal pain: Secondary | ICD-10-CM

## 2018-03-24 DIAGNOSIS — D251 Intramural leiomyoma of uterus: Secondary | ICD-10-CM | POA: Diagnosis not present

## 2018-03-24 NOTE — Progress Notes (Signed)
GYNECOLOGY  VISIT  CC:   LLQ pain  HPI: 62 y.o. G2P2 Widowed Caucasian female here for LLQ pain that has been intermittent over the last few months.  Reports the most recent episode started last week.  It was more intense.  This last episode was associated with some bloating, fatigue, and chills.  She felt "something else was going on as well".  Pain is better but does seem to be lingering.    Denies vaginal bleeding.  Denies vaginal discharge.  She does have some low back pain but this isn't new.  Denies urinary urgency, dysuria.  Denies any urinary change.    Bowel function hasn't changed.  Did not do cologuard last year.  Willing to try to do it this year.  GYNECOLOGIC HISTORY: Patient's last menstrual period was 11/16/2012. Contraception: PMP Menopausal hormone therapy: none  There are no active problems to display for this patient.   Past Medical History:  Diagnosis Date  . Frozen shoulder left   did PT  . Osteopenia     Past Surgical History:  Procedure Laterality Date  . CHOLECYSTECTOMY  07/2006  . CYSTECTOMY Left    paratubal cyst removal    MEDS:   Current Outpatient Medications on File Prior to Visit  Medication Sig Dispense Refill  . ibuprofen (ADVIL,MOTRIN) 200 MG tablet Take 200 mg by mouth every 6 (six) hours as needed for pain.     No current facility-administered medications on file prior to visit.     ALLERGIES: Compazine [prochlorperazine]; Minocin [minocycline]; Naproxen; Prednisone; and Sulfa antibiotics  Family History  Problem Relation Age of Onset  . Heart attack Brother 22  . Heart attack Brother        12 heart attacks and open heart surgery  . Diabetes Brother   . Heart attack Mother        heart surgery/staph infection  . Heart attack Sister        open heart surgery  . Congestive Heart Failure Father   . Stroke Father   . Hypertension Father   . Breast cancer Neg Hx     SH:  Widowed, non smoker  Review of Systems    Gastrointestinal: Negative for constipation, diarrhea, heartburn, nausea and vomiting.  Genitourinary: Negative for dysuria, frequency, hematuria and urgency.       Left sided pelvic pain  All other systems reviewed and are negative.   PHYSICAL EXAMINATION:    BP 126/84 (BP Location: Right Arm, Patient Position: Sitting, Cuff Size: Large)   Pulse 96   Resp 16   Ht 5' 2.5" (1.588 m)   Wt 183 lb (83 kg)   LMP 11/16/2012   BMI 32.94 kg/m     General appearance: alert, cooperative and appears stated age CV:  Regular rate and rhythm Lungs:  clear to auscultation, no wheezes, rales or rhonchi, symmetric air entry Abdomen: soft, non-tender; bowel sounds normal; no masses,  no organomegaly  Pelvic: External genitalia:  no lesions              Urethra:  normal appearing urethra with no masses, tenderness or lesions              Bartholins and Skenes: normal                 Vagina: normal appearing vagina with normal color and discharge, no lesions              Cervix: no lesions  Bimanual Exam:  Uterus:  normal size, contour, position, consistency, mobility, non-tender              Adnexa: no mass, fullness, tenderness   Ultrasound performed today.  Findings are as follows: Uterus: 4.2 x 4.2 x 2.6 cm with a 1.2 x 0.5 cm fibroid  Endometrium: 2.5 mm Left ovary: 2.6 x 1.9 x 1.2 cm with left adnexal tubular structure measuring 21 x 18 mm consistent with hydrosalpinx or paratubal cyst Right ovary: 2.2 x 1.4 x 0.8 cm with 3 x 5 mm follicle Cul-de-sac: No free fluid was noted  Chaperone was present for exam.  Discussion:.  Patient had a paratubal cyst removed laparoscopically about 10 years ago.  Findings reviewed with patient today.  I feel this is more likely a hydrosalpinx from peers and ultrasound.  Ovaries are otherwise normal.  Surgical excision was reviewed.  Laparoscopic approach with risks and benefits reviewed.  Recovery were all discussed.  She does not think she wants  to proceed with any surgery at this time.  She will consider this and return for AEX that is already scheduled in another week or so.  Knows to call with any acute changes in symptoms.     Assessment: LLQ pain Possible hydrosalpinx vs paratubal cyst  Plan: Return for AEX as is already scheduled.   ~20 minutes spent with patient >50% of time was in face to face discussion of above.   Intramural fibroid

## 2018-03-27 DIAGNOSIS — N7011 Chronic salpingitis: Secondary | ICD-10-CM | POA: Insufficient documentation

## 2018-03-27 DIAGNOSIS — D251 Intramural leiomyoma of uterus: Secondary | ICD-10-CM | POA: Insufficient documentation

## 2018-03-29 ENCOUNTER — Ambulatory Visit: Payer: BLUE CROSS/BLUE SHIELD | Admitting: Obstetrics & Gynecology

## 2018-03-29 ENCOUNTER — Encounter: Payer: Self-pay | Admitting: Obstetrics & Gynecology

## 2018-03-29 ENCOUNTER — Encounter

## 2018-03-29 ENCOUNTER — Other Ambulatory Visit: Payer: Self-pay

## 2018-03-29 VITALS — BP 132/90 | HR 80 | Resp 16 | Ht 62.25 in | Wt 181.8 lb

## 2018-03-29 DIAGNOSIS — Z01419 Encounter for gynecological examination (general) (routine) without abnormal findings: Secondary | ICD-10-CM | POA: Diagnosis not present

## 2018-03-29 DIAGNOSIS — Z0000 Encounter for general adult medical examination without abnormal findings: Secondary | ICD-10-CM

## 2018-03-29 DIAGNOSIS — E2839 Other primary ovarian failure: Secondary | ICD-10-CM | POA: Diagnosis not present

## 2018-03-29 DIAGNOSIS — Z0184 Encounter for antibody response examination: Secondary | ICD-10-CM

## 2018-03-29 LAB — POCT URINALYSIS DIPSTICK
BILIRUBIN UA: NEGATIVE
GLUCOSE UA: NEGATIVE
Ketones, UA: NEGATIVE
NITRITE UA: NEGATIVE
Protein, UA: NEGATIVE
RBC UA: NEGATIVE
Urobilinogen, UA: 0.2 E.U./dL
pH, UA: 5 (ref 5.0–8.0)

## 2018-03-29 NOTE — Addendum Note (Signed)
Addended by: Polly Cobia on: 03/29/2018 04:38 PM   Modules accepted: Orders

## 2018-03-29 NOTE — Progress Notes (Signed)
62 y.o. G2P2 WidowedCaucasianF here for annual exam.  Is planning on doing the cologuard this year.  Denies vaginal bleeding.  Pain is better.    Patient's last menstrual period was 11/16/2012.          Sexually active: No.  The current method of family planning is post menopausal status.    Exercising: No.   Smoker:  no  Health Maintenance: Pap:  12/25/16 Neg with neg HR HPV  06/21/15 Neg  History of abnormal Pap:  no MMG:  02/12/17 BIRADS1:Neg  Colonoscopy:  2007 f/u 5 years  BMD:   03/04/11 Osteopenia TDaP: unsure Pneumonia vaccine(s):  Never Shingrix:   never Hep C testing: 12/25/16 Neg  Screening Labs: fasting labs today   reports that she has quit smoking. She has never used smokeless tobacco. She reports that she does not drink alcohol or use drugs.  Past Medical History:  Diagnosis Date  . Frozen shoulder left   did PT  . Osteopenia     Past Surgical History:  Procedure Laterality Date  . CHOLECYSTECTOMY  07/2006  . CYSTECTOMY Left    paratubal cyst removal    Current Outpatient Medications  Medication Sig Dispense Refill  . ibuprofen (ADVIL,MOTRIN) 200 MG tablet Take 200 mg by mouth every 6 (six) hours as needed for pain.     No current facility-administered medications for this visit.     Family History  Problem Relation Age of Onset  . Heart attack Brother 25  . Heart attack Brother        12 heart attacks and open heart surgery  . Diabetes Brother   . Heart attack Mother        heart surgery/staph infection  . Heart attack Sister        open heart surgery  . Congestive Heart Failure Father   . Stroke Father   . Hypertension Father   . Breast cancer Neg Hx     Review of Systems  HENT: Positive for congestion.   Genitourinary: Positive for urgency.       Loss of urine with sneeze or cough  Night urination   Musculoskeletal: Positive for myalgias.  All other systems reviewed and are negative.   Exam:   BP 132/90 (BP Location: Left Arm, Patient  Position: Sitting, Cuff Size: Large)   Pulse 80   Resp 16   Ht 5' 2.25" (1.581 m)   Wt 181 lb 12.8 oz (82.5 kg)   LMP 11/16/2012   BMI 32.99 kg/m    Height: 5' 2.25" (158.1 cm)  Ht Readings from Last 3 Encounters:  03/29/18 5' 2.25" (1.581 m)  03/24/18 5' 2.5" (1.588 m)  12/25/16 5' 2.5" (1.588 m)    General appearance: alert, cooperative and appears stated age Head: Normocephalic, without obvious abnormality, atraumatic Neck: no adenopathy, supple, symmetrical, trachea midline and thyroid normal to inspection and palpation Lungs: clear to auscultation bilaterally Breasts: normal appearance, no masses or tenderness Heart: regular rate and rhythm Abdomen: soft, non-tender; bowel sounds normal; no masses,  no organomegaly Extremities: extremities normal, atraumatic, no cyanosis or edema Skin: Skin color, texture, turgor normal. No rashes or lesions Lymph nodes: Cervical, supraclavicular, and axillary nodes normal. No abnormal inguinal nodes palpated Neurologic: Grossly normal   Pelvic: External genitalia:  no lesions              Urethra:  normal appearing urethra with no masses, tenderness or lesions  Bartholins and Skenes: normal                 Vagina: normal appearing vagina with normal color and discharge, no lesions              Cervix: no lesions              Pap taken: No. Bimanual Exam:  Uterus:  normal size, contour, position, consistency, mobility, non-tender              Adnexa: normal adnexa and no mass, fullness, tenderness               Rectovaginal: Confirms               Anus:  normal sphincter tone, no lesions  Chaperone was present for exam.  A:  Well Woman with normal exam PMP, no HRT Family hx of CVD (3 siblings, one parent) Cystocele  P:   Mammogram guidelines reviewed.  Pt aware this is overdue.   Planning BMD with last MMG. Vit D  Measles antibody testing will be obtained due to upcoming travel Cologuard order placed Aware Tdap is  due.  Pt declines. Return annually or prn

## 2018-03-30 LAB — LIPID PANEL
CHOL/HDL RATIO: 3.4 ratio (ref 0.0–4.4)
Cholesterol, Total: 203 mg/dL — ABNORMAL HIGH (ref 100–199)
HDL: 59 mg/dL (ref >39)
LDL Calculated: 121 mg/dL — ABNORMAL HIGH (ref 0–99)
TRIGLYCERIDES: 113 mg/dL (ref 0–149)
VLDL CHOLESTEROL CAL: 23 mg/dL (ref 5–40)

## 2018-03-30 LAB — VITAMIN D 25 HYDROXY (VIT D DEFICIENCY, FRACTURES): Vit D, 25-Hydroxy: 20.1 ng/mL — ABNORMAL LOW (ref 30.0–100.0)

## 2018-03-30 LAB — RUBEOLA ANTIBODY IGG: RUBEOLA AB, IGG: >300 AU/mL (ref >29.9)

## 2018-06-10 ENCOUNTER — Telehealth: Payer: Self-pay | Admitting: *Deleted

## 2018-06-10 NOTE — Telephone Encounter (Signed)
Detailed message left for patient per Dr. Sabra Heck encouraging her to complete her cologuard testing. Advised patient to return call to office with any additional questions.   Routing to provider for final review. Will close encounter.

## 2018-09-07 ENCOUNTER — Other Ambulatory Visit: Payer: Self-pay | Admitting: Obstetrics & Gynecology

## 2018-09-07 DIAGNOSIS — Z1231 Encounter for screening mammogram for malignant neoplasm of breast: Secondary | ICD-10-CM

## 2018-11-15 ENCOUNTER — Telehealth: Payer: Self-pay | Admitting: Obstetrics & Gynecology

## 2018-11-15 NOTE — Telephone Encounter (Signed)
Left message to call Sharee Pimple, RN at [facility].    Last Vit D 20.1 on 03/29/18, declined Rx.

## 2018-11-15 NOTE — Telephone Encounter (Signed)
Patient returning [facility] call. States detailed message can be left on cell.

## 2018-11-15 NOTE — Telephone Encounter (Signed)
She would likely benefit from more than 1000 IU daily.  I would suggest 2000IU daily.  Also, if desires to have level checked about 2-3 months of OTC Vit D, this is ok to and would ensure the level is improving.  Thanks.

## 2018-11-15 NOTE — Telephone Encounter (Signed)
Spoke with patient. Patient states that she has been feeling more fatigued lately. Feels this is related to her Vitamin D being low. States that her Vitamin D has been low before and she felt the same way. Advised patient should have another Vitamin D recheck in the office since it has been since May. May have other things contributing to symptoms. Patient states she just wants to take an OTC vitamin d. Advised may take this in a daily multivitamin or OTC Vitamin D 1,000 IU daily. Advised we cannot know how much Vitamin D she needs without having a Vitamin D level. Agreeable to reviewing with Dr.Miller and returning call with recommendations.

## 2018-11-15 NOTE — Telephone Encounter (Signed)
Left detailed message, requested return call to further discuss symptoms and confirm current dosage of daily Vit D intake.

## 2018-11-15 NOTE — Telephone Encounter (Signed)
Left message to call Sharee Pimple, RN at [facility].    PATIENT MAY SPEAK WITH ANY AVAILABLE NURSE

## 2018-11-15 NOTE — Telephone Encounter (Signed)
Patient stated that she has had low vit D in the past. Patient stated that she feels like her vit D is low and is wanting to take an over the counter medication and would like suggestions.

## 2018-11-15 NOTE — Telephone Encounter (Signed)
Patient returned call

## 2018-11-17 NOTE — Telephone Encounter (Signed)
Left detailed message, ok per dpr. Advised as seen below per Dr. Sabra Heck. Advised to return call to office to schedule lab appt if desires to have level checked or with any additional questions/concerns.   Encounter closed.

## 2019-06-29 ENCOUNTER — Ambulatory Visit: Payer: BLUE CROSS/BLUE SHIELD | Admitting: Obstetrics & Gynecology

## 2019-06-29 ENCOUNTER — Encounter

## 2019-07-11 ENCOUNTER — Other Ambulatory Visit: Payer: Self-pay

## 2019-07-12 ENCOUNTER — Other Ambulatory Visit (HOSPITAL_COMMUNITY)
Admission: RE | Admit: 2019-07-12 | Discharge: 2019-07-12 | Disposition: A | Discharge status: Home / Self Care | Payer: BC Managed Care – PPO | Source: Ambulatory Visit | Attending: Obstetrics & Gynecology | Admitting: Obstetrics & Gynecology

## 2019-07-12 ENCOUNTER — Encounter: Payer: Self-pay | Admitting: Obstetrics & Gynecology

## 2019-07-12 ENCOUNTER — Ambulatory Visit: Payer: BC Managed Care – PPO | Admitting: Obstetrics & Gynecology

## 2019-07-12 VITALS — BP 116/78 | HR 84 | Temp 97.2°F | Resp 12 | Ht 62.0 in | Wt 168.0 lb

## 2019-07-12 DIAGNOSIS — N950 Postmenopausal bleeding: Secondary | ICD-10-CM | POA: Diagnosis not present

## 2019-07-12 DIAGNOSIS — N8501 Benign endometrial hyperplasia: Secondary | ICD-10-CM | POA: Diagnosis not present

## 2019-07-12 DIAGNOSIS — N858 Other specified noninflammatory disorders of uterus: Secondary | ICD-10-CM | POA: Diagnosis not present

## 2019-07-12 DIAGNOSIS — Z01419 Encounter for gynecological examination (general) (routine) without abnormal findings: Secondary | ICD-10-CM

## 2019-07-12 DIAGNOSIS — Z0000 Encounter for general adult medical examination without abnormal findings: Secondary | ICD-10-CM | POA: Diagnosis not present

## 2019-07-12 NOTE — Patient Instructions (Signed)

## 2019-07-12 NOTE — Progress Notes (Signed)
63 y.o. G2P2 Widowed White or Caucasian female here for annual exam.  Last few months have been stressful.  She was laid off from her job in May.  Daughter was laid off the same week.  They have been very careful during this time.    She reports having some vaginal bleeding once or twice over the past year.  The bleeding was bright red.    Patient's last menstrual period was 11/16/2012.          Sexually active: No.  The current method of family planning is post menopausal status.    Exercising: No.  The patient does not participate in regular exercise at present. Smoker:  no  Health Maintenance: Pap:  12/25/16 Neg with neg HR HPV             06/21/15 Neg  History of abnormal Pap:  no MMG:  02/12/17 BIRADS1:Neg Colonoscopy:  2007 f/u 5 years.  Aware this is due.  She is going to try and do the cologuard this year.  She is going to check with insurance about coverage.   BMD:   03/04/11 Osteopenia.  Declines having this done.   TDaP:  Due.  Pt declines Pneumonia vaccine(s):  no Shingrix:   Declines Hep C testing: 12/25/16 Negative Screening Labs: discuss if needed   reports that she has quit smoking. She has never used smokeless tobacco. She reports that she does not drink alcohol or use drugs.  Past Medical History:  Diagnosis Date  . Frozen shoulder left   did PT  . Osteopenia     Past Surgical History:  Procedure Laterality Date  . CHOLECYSTECTOMY  07/2006  . CYSTECTOMY Left    paratubal cyst removal    Current Outpatient Medications  Medication Sig Dispense Refill  . ibuprofen (ADVIL,MOTRIN) 200 MG tablet Take 200 mg by mouth every 6 (six) hours as needed for pain.     No current facility-administered medications for this visit.     Family History  Problem Relation Age of Onset  . Heart attack Brother 83  . Heart attack Brother        12 heart attacks and open heart surgery  . Diabetes Brother   . Heart attack Mother        heart surgery/staph infection  . Heart attack  Sister        open heart surgery  . Congestive Heart Failure Father   . Stroke Father   . Hypertension Father   . Breast cancer Neg Hx     Review of Systems  Constitutional: Negative.   HENT: Negative.   Eyes: Negative.   Respiratory: Negative.   Cardiovascular: Negative.   Gastrointestinal: Negative.   Endocrine: Negative.   Genitourinary: Negative.   Musculoskeletal: Negative.   Skin: Negative.   Allergic/Immunologic: Negative.   Neurological: Negative.   Hematological: Negative.   Psychiatric/Behavioral: Negative.     Exam:   BP 116/78 (BP Location: Right Arm, Patient Position: Sitting, Cuff Size: Large)   Pulse 84   Temp (!) 97.2 F (36.2 C) (Temporal)   Resp 12   Ht 5\' 2"  (1.575 m)   Wt 168 lb (76.2 kg)   LMP 11/16/2012   BMI 30.73 kg/m   Height: 5\' 2"  (157.5 cm)  Ht Readings from Last 3 Encounters:  07/12/19 5\' 2"  (1.575 m)  03/29/18 5' 2.25" (1.581 m)  03/24/18 5' 2.5" (1.588 m)    General appearance: alert, cooperative and appears stated age Head: Normocephalic,  without obvious abnormality, atraumatic Neck: no adenopathy, supple, symmetrical, trachea midline and thyroid normal to inspection and palpation Lungs: clear to auscultation bilaterally Breasts: normal appearance, no masses or tenderness Heart: regular rate and rhythm Abdomen: soft, non-tender; bowel sounds normal; no masses,  no organomegaly Extremities: extremities normal, atraumatic, no cyanosis or edema Skin: Skin color, texture, turgor normal. No rashes or lesions Lymph nodes: Cervical, supraclavicular, and axillary nodes normal. No abnormal inguinal nodes palpated Neurologic: Grossly normal   Pelvic: External genitalia:  no lesions              Urethra:  normal appearing urethra with no masses, tenderness or lesions              Bartholins and Skenes: normal                 Vagina: normal appearing vagina with normal color and discharge, no lesions              Cervix: no lesions               Pap taken: No. Bimanual Exam:  Uterus:  normal size, contour, position, consistency, mobility, non-tender              Adnexa: normal adnexa and no mass, fullness, tenderness               Rectovaginal: Confirms               Anus:  normal sphincter tone, no lesions  Endometrial biopsy recommended.  Discussed with patient.  Verbal and written consent obtained.   Procedure:  Speculum placed.  Cervix visualized and cleansed with betadine prep.  A single toothed tenaculum was applied to the anterior lip of the cervix.  Endometrial pipelle was advanced through the cervix into the endometrial cavity without difficulty.  Pipelle passed to 8cm.  Suction applied and pipelle removed with good tissue sample obtained.  Tenculum removed.  No bleeding noted.  Patient tolerated procedure well.  Chaperone was present for exam.  A:  Well Woman with normal exam PMP, no HRT Family hx of CVD (3 siblings, one parent) Cystocele PMP bleeding  P:   Mammogram guidelines reviewed.  Aware this is due. pap smear with neg HR HPV 12/25/16.  Not indicated today. CBC, CMP, Lipids, TSH and Vit D obtained today Endometrial biopsy obtained today Cologuard order placed Declines Tdap update, shingrix vaccination, BMD or MMG this year Return annually or prn

## 2019-07-13 LAB — COMPREHENSIVE METABOLIC PANEL
ALT: 13 IU/L (ref 0–32)
AST: 16 IU/L (ref 0–40)
Albumin/Globulin Ratio: 1.6 (ref 1.2–2.2)
Albumin: 4.5 g/dL (ref 3.8–4.8)
Alkaline Phosphatase: 87 IU/L (ref 39–117)
BUN/Creatinine Ratio: 27 (ref 12–28)
BUN: 20 mg/dL (ref 8–27)
Bilirubin Total: 0.5 mg/dL (ref 0.0–1.2)
CO2: 24 mmol/L (ref 20–29)
Calcium: 9.8 mg/dL (ref 8.7–10.3)
Chloride: 96 mmol/L (ref 96–106)
Creatinine, Ser: 0.73 mg/dL (ref 0.57–1.00)
GFR calc Af Amer: 102 mL/min/{1.73_m2} (ref >59)
GFR calc non Af Amer: 89 mL/min/{1.73_m2} (ref >59)
Globulin, Total: 2.9 g/dL (ref 1.5–4.5)
Glucose: 89 mg/dL (ref 65–99)
Potassium: 4.5 mmol/L (ref 3.5–5.2)
Sodium: 142 mmol/L (ref 134–144)
Total Protein: 7.4 g/dL (ref 6.0–8.5)

## 2019-07-13 LAB — LIPID PANEL
Chol/HDL Ratio: 3.4 ratio (ref 0.0–4.4)
Cholesterol, Total: 220 mg/dL — ABNORMAL HIGH (ref 100–199)
HDL: 64 mg/dL (ref >39)
LDL Calculated: 131 mg/dL — ABNORMAL HIGH (ref 0–99)
Triglycerides: 125 mg/dL (ref 0–149)
VLDL Cholesterol Cal: 25 mg/dL (ref 5–40)

## 2019-07-13 LAB — CBC
Hematocrit: 48.6 % — ABNORMAL HIGH (ref 34.0–46.6)
Hemoglobin: 15.4 g/dL (ref 11.1–15.9)
MCH: 28.8 pg (ref 26.6–33.0)
MCHC: 31.7 g/dL (ref 31.5–35.7)
MCV: 91 fL (ref 79–97)
Platelets: 219 10*3/uL (ref 150–450)
RBC: 5.34 x10E6/uL — ABNORMAL HIGH (ref 3.77–5.28)
RDW: 13.1 % (ref 11.7–15.4)
WBC: 5.3 10*3/uL (ref 3.4–10.8)

## 2019-07-13 LAB — VITAMIN D 25 HYDROXY (VIT D DEFICIENCY, FRACTURES): Vit D, 25-Hydroxy: 14.5 ng/mL — ABNORMAL LOW (ref 30.0–100.0)

## 2019-07-13 LAB — TSH: TSH: 1.76 u[IU]/mL (ref 0.450–4.500)

## 2019-07-17 ENCOUNTER — Telehealth: Payer: Self-pay | Admitting: *Deleted

## 2019-07-17 DIAGNOSIS — E559 Vitamin D deficiency, unspecified: Secondary | ICD-10-CM

## 2019-07-17 MED ORDER — VITAMIN D (ERGOCALCIFEROL) 1.25 MG (50000 UNIT) PO CAPS: 50000.0000 [IU] | ORAL_CAPSULE | ORAL | 0 refills | Status: DC

## 2019-07-17 NOTE — Telephone Encounter (Signed)
Patient notified and agreed to take Vitamin D. Rx sent to pharmacy. Lab order placed.  Lab appt scheduled.

## 2019-07-17 NOTE — Telephone Encounter (Signed)
-----   Message from Megan Salon, MD sent at 07/14/2019  5:53 PM EDT ----- Please let pt know her endometrial biopsy was negative for abnormal cells.  Her CMP, thyroid, CBC, CMP were fine.  Cholestrol was mildly elevated at 220 and LDLs were 131.  This does not need treatment and can be watched.  Her Vit D was again low at 14.  Ok to treat with Vit D 50K weekly x 12 weeks and repeat lab in 12 weeks.  She has declined this in the past but indicated she might take it this time.  Ok to send in rx and schedule follow up lab if she is ok with this.  Thanks.

## 2019-07-17 NOTE — Telephone Encounter (Signed)
LM for pt to call back.

## 2019-08-22 ENCOUNTER — Encounter: Payer: Self-pay | Admitting: Obstetrics & Gynecology

## 2019-08-22 DIAGNOSIS — Z1212 Encounter for screening for malignant neoplasm of rectum: Secondary | ICD-10-CM | POA: Diagnosis not present

## 2019-08-22 DIAGNOSIS — Z1211 Encounter for screening for malignant neoplasm of colon: Secondary | ICD-10-CM | POA: Diagnosis not present

## 2019-08-22 LAB — COLOGUARD

## 2019-09-04 ENCOUNTER — Telehealth: Payer: Self-pay | Admitting: *Deleted

## 2019-09-04 NOTE — Telephone Encounter (Signed)
Left voicemail with negative Cologuard Results.   Report to scan.

## 2019-09-25 ENCOUNTER — Encounter

## 2019-09-25 ENCOUNTER — Ambulatory Visit: Payer: BLUE CROSS/BLUE SHIELD | Admitting: Obstetrics & Gynecology

## 2019-10-09 ENCOUNTER — Other Ambulatory Visit: Payer: Self-pay

## 2019-10-10 ENCOUNTER — Other Ambulatory Visit (INDEPENDENT_AMBULATORY_CARE_PROVIDER_SITE_OTHER): Payer: BC Managed Care – PPO

## 2019-10-10 ENCOUNTER — Encounter: Payer: Self-pay | Admitting: Obstetrics & Gynecology

## 2019-10-10 DIAGNOSIS — E559 Vitamin D deficiency, unspecified: Secondary | ICD-10-CM | POA: Diagnosis not present

## 2019-10-11 ENCOUNTER — Telehealth: Payer: Self-pay | Admitting: Obstetrics & Gynecology

## 2019-10-11 DIAGNOSIS — U071 COVID-19: Secondary | ICD-10-CM

## 2019-10-11 LAB — VITAMIN D 25 HYDROXY (VIT D DEFICIENCY, FRACTURES): Vit D, 25-Hydroxy: 34.5 ng/mL (ref 30.0–100.0)

## 2019-10-11 NOTE — Telephone Encounter (Signed)
Patient sent the following correspondence through [facility].  Dr. Sabra Heck  I came in today for my blood work to recheck the Vitamin D, I ask the lab tech that took the blood if they could check what my blood type is, was not sure if that is in my file or not and also to check for antibody for Covid-19 to see if I have had it, she said to send you a message on here. Was not sure if my insurance covers that or how much it cost just asking. As for the Vitamin D, I have one pill left for next week.  I hope you and your family have Blessed and Happy Thanksgiving!!

## 2019-10-11 NOTE — Telephone Encounter (Signed)
Pt wanting to get Covid-antibody screening and ABO.   Spoke with Lorre Nick in office lab, has enough blood that was collected on 10/10/19 for Vitamin D test to run Covid screen.   Called pt. Left detailed message to call PCP to see if ABO results are there and if not then to ask PCP to run ABO. Pt to call back with any questions.   Will route to Dr Sabra Heck Covid screen orders placed.

## 2019-10-16 ENCOUNTER — Telehealth: Payer: Self-pay | Admitting: *Deleted

## 2019-10-16 ENCOUNTER — Encounter: Payer: Self-pay | Admitting: Obstetrics & Gynecology

## 2019-10-16 ENCOUNTER — Telehealth: Payer: Self-pay

## 2019-10-16 NOTE — Telephone Encounter (Signed)
Quick question about the coronavirus antibodies that was done, spoke to [place] in the office today she stated that it was positive, but on my chart it has negative. Was not sure if it is positive or negative, may have looked at it wrong. Could please let me know. Also does Dr. Sabra Heck have any recommendations for the over the counter Vitamin d Thank you so much and have a blessed day.

## 2019-10-16 NOTE — Telephone Encounter (Signed)
Left message for pt to return call to Triage.

## 2019-10-16 NOTE — Telephone Encounter (Signed)
-----   Message from Megan Salon, MD sent at 10/16/2019  7:02 AM EST ----- Please let pt know her covid antibody testing was positive.  This shows she did have Covid.  She does need to continue to wear masks as there are documented cases of reinfection.

## 2019-10-16 NOTE — Telephone Encounter (Signed)
Tried calling patient regarding results. No answer, left message for patient to return my call back at [phone].

## 2019-10-16 NOTE — Telephone Encounter (Signed)
Spoke with pt. Pt returned call. Pt wanted clarification on Covid antibody test. Questions answered. Pt also will get 2000 IU vit D to take daily. Will let Dr Sabra Heck know update if leg aches return and if regimen of daily Vit D capsules aren't working.   Will route to Dr Sabra Heck for review and will close encounter.

## 2019-10-16 NOTE — Telephone Encounter (Signed)
Results reviewed with patient in detail as written by provider.

## 2019-10-18 ENCOUNTER — Other Ambulatory Visit: Payer: BC Managed Care – PPO

## 2019-10-20 LAB — EUROIMMUN SARS-COV-2 AB, IGG: Euroimmun SARS-CoV-2 Ab, IgG: POSITIVE — AB

## 2019-10-20 LAB — SPECIMEN STATUS REPORT

## 2019-10-20 LAB — SAR COV2 SEROLOGY (COVID19)AB(IGG),IA

## 2020-03-27 ENCOUNTER — Other Ambulatory Visit: Payer: Self-pay | Admitting: Obstetrics & Gynecology

## 2020-03-27 DIAGNOSIS — Z1231 Encounter for screening mammogram for malignant neoplasm of breast: Secondary | ICD-10-CM

## 2020-09-10 NOTE — Progress Notes (Signed)
64 y.o. G2P2 Widowed White or Caucasian female here for annual exam.  Denies vaginal bleeding.  Still looking for work.  Daughter was laid off from same company as well.  This has created some stressors.    Has noticed some fluttering in her chest at times.  Does have family hx of cardiovascular disease.  Has not seen cardiologist but would like to as has some concerns.  Does occasional walk.  No worsening symptoms with this.  Denies vaginal bleeding.  Patient's last menstrual period was 11/16/2012.          Sexually active: No.  The current method of family planning is post menopausal status.    Exercising: Yes.    walking Smoker:  no  Health Maintenance: Pap:  12-25-16 neg HPV HR neg History of abnormal Pap:  no MMG:  02-12-17 category b density birads 1:neg Colonoscopy:  2007 f/u 78yrs, cologuard neg 2020 BMD:   03-04-11 osteopenia TDaP:  unsure Pneumonia vaccine(s):  none Shingrix:   none Hep C testing: neg 2018 Screening Labs: obtained today   reports that she has quit smoking. She has never used smokeless tobacco. She reports that she does not drink alcohol and does not use drugs.  Past Medical History:  Diagnosis Date  . Frozen shoulder left   did PT  . Osteopenia     Past Surgical History:  Procedure Laterality Date  . CHOLECYSTECTOMY  07/2006  . CYSTECTOMY Left    paratubal cyst removal    Current Outpatient Medications  Medication Sig Dispense Refill  . VITAMIN D PO Take by mouth.     No current facility-administered medications for this visit.    Family History  Problem Relation Age of Onset  . Heart attack Brother 65  . Heart attack Brother        12 heart attacks and open heart surgery  . Diabetes Brother   . Heart attack Mother        heart surgery/staph infection  . Heart attack Sister        open heart surgery  . Congestive Heart Failure Father   . Stroke Father   . Hypertension Father   . Breast cancer Neg Hx     Review of Systems   Constitutional: Positive for fatigue.  HENT: Negative.   Eyes: Negative.   Respiratory: Negative.   Cardiovascular: Negative.   Gastrointestinal: Negative.   Endocrine: Negative.   Genitourinary: Negative.   Musculoskeletal:       Joint pain  Skin: Negative.   Allergic/Immunologic: Negative.   Neurological: Negative.        Noticing problems with memory  Hematological: Negative.   Psychiatric/Behavioral: Negative.     Exam:   Ht 5' 1.75" (1.568 m)   Wt 185 lb (83.9 kg)   LMP 11/16/2012   BMI 34.11 kg/m   Height: 5' 1.75" (156.8 cm)  General appearance: alert, cooperative and appears stated age Head: Normocephalic, without obvious abnormality, atraumatic Neck: no adenopathy, supple, symmetrical, trachea midline and thyroid normal to inspection and palpation Lungs: clear to auscultation bilaterally Breasts: normal appearance, no masses or tenderness Heart: regular rate and rhythm Abdomen: soft, non-tender; bowel sounds normal; no masses,  no organomegaly Extremities: extremities normal, atraumatic, no cyanosis or edema Skin: Skin color, texture, turgor normal. No rashes or lesions Lymph nodes: Cervical, supraclavicular, and axillary nodes normal. No abnormal inguinal nodes palpated Neurologic: Grossly normal   Pelvic: External genitalia:  no lesions  Urethra:  normal appearing urethra with no masses, tenderness or lesions              Bartholins and Skenes: normal                 Vagina: normal appearing vagina with normal color and discharge, no lesions, midline cystocele noted              Cervix: no lesions              Pap taken: Yes.   Bimanual Exam:  Uterus:  normal size, contour, position, consistency, mobility, non-tender              Adnexa: normal adnexa and no mass, fullness, tenderness               Rectovaginal: Confirms               Anus:  normal sphincter tone, no lesions  Chaperone, Olene Floss, CMA, was present for exam.  A:  WA:          Well Woman with normal exam PMP, no HRT Family hx of CVD (3 siblings, one parent) with recent heart fluttering  Cystocele Vit D deficiency Typically doesn't see PCP  P:         Mammogram guidelines reviewed.  Does yearly. Planning BMD with MMG.  Order placed. Vit D, CMP, lipids, CBC Cardiology referral placed Cologuard neg 2020 Aware Tdap is due.  Pt declines.

## 2020-09-13 ENCOUNTER — Encounter: Payer: Self-pay | Admitting: Obstetrics & Gynecology

## 2020-09-13 ENCOUNTER — Ambulatory Visit (INDEPENDENT_AMBULATORY_CARE_PROVIDER_SITE_OTHER): Payer: BC Managed Care – PPO | Admitting: Obstetrics & Gynecology

## 2020-09-13 ENCOUNTER — Other Ambulatory Visit: Payer: Self-pay

## 2020-09-13 ENCOUNTER — Telehealth: Payer: Self-pay

## 2020-09-13 ENCOUNTER — Other Ambulatory Visit (HOSPITAL_COMMUNITY)
Admission: RE | Admit: 2020-09-13 | Discharge: 2020-09-13 | Disposition: A | Discharge status: Home / Self Care | Payer: BC Managed Care – PPO | Source: Ambulatory Visit | Attending: Obstetrics & Gynecology | Admitting: Obstetrics & Gynecology

## 2020-09-13 VITALS — BP 112/70 | HR 68 | Resp 16 | Ht 61.75 in | Wt 185.0 lb

## 2020-09-13 DIAGNOSIS — M8580 Other specified disorders of bone density and structure, unspecified site: Secondary | ICD-10-CM | POA: Diagnosis not present

## 2020-09-13 DIAGNOSIS — Z0000 Encounter for general adult medical examination without abnormal findings: Secondary | ICD-10-CM

## 2020-09-13 DIAGNOSIS — Z01419 Encounter for gynecological examination (general) (routine) without abnormal findings: Secondary | ICD-10-CM

## 2020-09-13 DIAGNOSIS — Z124 Encounter for screening for malignant neoplasm of cervix: Secondary | ICD-10-CM | POA: Insufficient documentation

## 2020-09-13 DIAGNOSIS — I498 Other specified cardiac arrhythmias: Secondary | ICD-10-CM

## 2020-09-13 DIAGNOSIS — U071 COVID-19: Secondary | ICD-10-CM

## 2020-09-13 NOTE — Telephone Encounter (Signed)
Pt states was seen today for AEX. Pt asking about adding Covid antibody testing. Pt was advised will review with Lab tech, Lucy and Dr Sabra Heck. Pt agreeable. Pt states had + Covid in May 2020 and had positive antibodies in 09/2019 per reviewed lab records.  Lorre Nick states pt will have to have Covid antibody testing added on after labs drawn for AEX today and wouldn't know if had enough blood until 11/1. Routing to Dr Sabra Heck for review and approval of added lab.  Cc: Lorre Nick

## 2020-09-13 NOTE — Telephone Encounter (Signed)
Patient would like to know if our office tests for covid antibodies.

## 2020-09-14 LAB — COMPREHENSIVE METABOLIC PANEL
ALT: 30 IU/L (ref 0–32)
AST: 22 IU/L (ref 0–40)
Albumin/Globulin Ratio: 1.7 (ref 1.2–2.2)
Albumin: 4.6 g/dL (ref 3.8–4.8)
Alkaline Phosphatase: 96 IU/L (ref 44–121)
BUN/Creatinine Ratio: 23 (ref 12–28)
BUN: 19 mg/dL (ref 8–27)
Bilirubin Total: 0.4 mg/dL (ref 0.0–1.2)
CO2: 25 mmol/L (ref 20–29)
Calcium: 9.5 mg/dL (ref 8.7–10.3)
Chloride: 104 mmol/L (ref 96–106)
Creatinine, Ser: 0.82 mg/dL (ref 0.57–1.00)
GFR calc Af Amer: 88 mL/min/{1.73_m2} (ref >59)
GFR calc non Af Amer: 76 mL/min/{1.73_m2} (ref >59)
Globulin, Total: 2.7 g/dL (ref 1.5–4.5)
Glucose: 83 mg/dL (ref 65–99)
Potassium: 4.2 mmol/L (ref 3.5–5.2)
Sodium: 141 mmol/L (ref 134–144)
Total Protein: 7.3 g/dL (ref 6.0–8.5)

## 2020-09-14 LAB — LIPID PANEL
Chol/HDL Ratio: 3.6 ratio (ref 0.0–4.4)
Cholesterol, Total: 217 mg/dL — ABNORMAL HIGH (ref 100–199)
HDL: 61 mg/dL (ref >39)
LDL Chol Calc (NIH): 140 mg/dL — ABNORMAL HIGH (ref 0–99)
Triglycerides: 90 mg/dL (ref 0–149)
VLDL Cholesterol Cal: 16 mg/dL (ref 5–40)

## 2020-09-14 LAB — CBC
Hematocrit: 44.8 % (ref 34.0–46.6)
Hemoglobin: 14.8 g/dL (ref 11.1–15.9)
MCH: 29.4 pg (ref 26.6–33.0)
MCHC: 33 g/dL (ref 31.5–35.7)
MCV: 89 fL (ref 79–97)
Platelets: 221 10*3/uL (ref 150–450)
RBC: 5.04 x10E6/uL (ref 3.77–5.28)
RDW: 12.6 % (ref 11.7–15.4)
WBC: 5.3 10*3/uL (ref 3.4–10.8)

## 2020-09-14 LAB — VITAMIN D 25 HYDROXY (VIT D DEFICIENCY, FRACTURES): Vit D, 25-Hydroxy: 35.6 ng/mL (ref 30.0–100.0)

## 2020-09-16 NOTE — Telephone Encounter (Signed)
Order signed as I am ok with this.  I think she would decide to be vaccinated if this is now negative.  Ok to close encounter.

## 2020-09-17 LAB — SPECIMEN STATUS REPORT

## 2020-09-17 LAB — CYTOLOGY - PAP
Comment: NEGATIVE
Diagnosis: NEGATIVE
High risk HPV: NEGATIVE

## 2020-09-17 LAB — SAR COV2 SEROLOGY (COVID19)AB(IGG),IA: DiaSorin SARS-CoV-2 Ab, IgG: POSITIVE

## 2020-09-19 ENCOUNTER — Telehealth: Payer: Self-pay

## 2020-09-19 NOTE — Telephone Encounter (Signed)
Patient returned call and results were discussed in detail as written below. Patient verbalizes understanding and is agreeable. Okay to close encounter.

## 2020-09-19 NOTE — Telephone Encounter (Signed)
-----   Message from Megan Salon, MD sent at 09/19/2020  6:12 AM EDT ----- Please let pt know her antibodies to Covid are still present.  She had natural infection last year.  I still think she should be vaccinated, however.  Her Vit D is 35.  She is on supplement.  I'm happy with this level.  CMP, CBC are normal.  Her cholesterol is mildly elevated but her risk of cardiovascular disease over the next 10 years is 3.5% so this is ok to watch.  Lastly her pap was negative and HR HPV normal.  02 recall.  Thanks.

## 2020-09-19 NOTE — Telephone Encounter (Signed)
Tried calling patient regarding results. No answer, left message for patient to call me back.

## 2020-09-20 ENCOUNTER — Telehealth: Payer: Self-pay

## 2020-09-20 NOTE — Telephone Encounter (Signed)
Patient does not remember the name of the cardiologist Dr Sabra Heck referred her to.

## 2020-09-20 NOTE — Telephone Encounter (Signed)
Spoke with patient. Patient calling to confirm provider she was referred to for cardiology. Advised Dr. Skeet Latch at [facility] at [facility]. Patient states she was contacted to schedule, has contact information, just wanted to confirm provider. Questions answered.   Encounter closed.

## 2020-10-01 ENCOUNTER — Other Ambulatory Visit: Payer: Self-pay | Admitting: Obstetrics & Gynecology

## 2020-10-01 DIAGNOSIS — Z1231 Encounter for screening mammogram for malignant neoplasm of breast: Secondary | ICD-10-CM

## 2020-10-08 ENCOUNTER — Encounter: Payer: Self-pay | Admitting: Cardiovascular Disease

## 2020-10-08 ENCOUNTER — Other Ambulatory Visit: Payer: Self-pay

## 2020-10-08 ENCOUNTER — Ambulatory Visit: Payer: BC Managed Care – PPO | Admitting: Cardiovascular Disease

## 2020-10-08 VITALS — BP 128/82 | HR 105 | Ht 60.0 in | Wt 186.2 lb

## 2020-10-08 DIAGNOSIS — R002 Palpitations: Secondary | ICD-10-CM | POA: Diagnosis not present

## 2020-10-08 DIAGNOSIS — E785 Hyperlipidemia, unspecified: Secondary | ICD-10-CM | POA: Diagnosis not present

## 2020-10-08 DIAGNOSIS — Z13220 Encounter for screening for lipoid disorders: Secondary | ICD-10-CM | POA: Diagnosis not present

## 2020-10-08 DIAGNOSIS — R0789 Other chest pain: Secondary | ICD-10-CM

## 2020-10-08 DIAGNOSIS — E7800 Pure hypercholesterolemia, unspecified: Secondary | ICD-10-CM

## 2020-10-08 NOTE — Patient Instructions (Signed)
Medication Instructions:  Your physician recommends that you continue on your current medications as directed. Please refer to the Current Medication list given to you today.  *If you need a refill on your cardiac medications before your next appointment, please call your pharmacy*  Lab Work: TSH TODAY   FASTING LP/CMET IN 4 MONTHS   If you have labs (blood work) drawn today and your tests are completely normal, you will receive your results only by: Marland Kitchen MyChart Message (if you have MyChart) OR . A paper copy in the mail If you have any lab test that is abnormal or we need to change your treatment, we will call you to review the results.  Testing/Procedures: CALL IF YOU DECIDE TO HAVE CALCIUM SCORE, THIS WILL COST YOU $150 OUT OF POCKET  Follow-Up: At [facility], you and your health needs are our priority.  As part of our continuing mission to provide you with exceptional heart care, we have created designated Provider Care Teams.  These Care Teams include your primary Cardiologist (physician) and Advanced Practice Providers (APPs -  Physician Assistants and Nurse Practitioners) who all work together to provide you with the care you need, when you need it.  We recommend signing up for the patient portal called "MyChart".  Sign up information is provided on this After Visit Summary.  MyChart is used to connect with patients for Virtual Visits (Telemedicine).  Patients are able to view lab/test results, encounter notes, upcoming appointments, etc.  Non-urgent messages can be sent to your provider as well.   To learn more about what you can do with MyChart, go to [email or web address].    Your next appointment:   12 month(s) You will receive a reminder letter in the mail two months in advance. If you don't receive a letter, please call our office to schedule the follow-up appointment.  The format for your next appointment:   In Person  Provider:   You may see DR [facility]  or one of  the following Advanced Practice Providers on your designated Care Team:    Kerin Ransom, PA-C  [place], [place]  Coletta Memos, [place]  Other Instructions  TRY TO INCREASE YOUR EXERCISE TO [place]

## 2020-10-08 NOTE — Progress Notes (Signed)
Cardiology Office Note   Date:  11/08/2020   ID:  Evelyn Bright, DOB 06-Oct-1956, MRN [id number]  PCP:  Shirline Frees, MD  Cardiologist:   Skeet Latch, MD   No chief complaint on file.    History of Present Illness: Evelyn Bright is a 64 y.o. female with hyperlipidemia who is being seen today for the evaluation of palpitations at the request of Megan Salon, MD.   She had COVID-19 in April/May 2020.  She was relatively asymptomatic other than lack of energy, taste and smell.  She did have an episode of syncope when ill but none since that time.  She has mild orthostasis when she bends over for a long period of time and stands up but denies any recurrent syncope.  Since having Covid she has intermittent episodes of palpitations.  They occur less than once a month and last for only a few moments at a time.  There is no associated shortness of breath, lightheadedness, or dizziness.  She does not drink caffeine.  She has a very healthy diet and is vegetarian and mostly drinks water.  She has been under a lot of stress.  Her husband passed away 3 years ago and had a very bad cardiac experience at [facility].  She also lost her job during the pandemic, as did her daughters who live with her.  She notes occasional sharp pain in her chest with never with exertion.  It always occurs at rest.  She does walk for exercise a few days per week.  She also rakes leaves in her yard.  She has no exertional chest pain or shortness of breath.  She does get somewhat tired after these activities.  She has a very extensive family history of heart disease.  She has a brother who had heart attacks at age 67.  Her father had a stroke in his 68s and also had heart disease.  Her mother had valvular heart disease and coronary artery disease.  Several of her family members did smoke.  She smoked briefly as a teen but has no significant firsthand use.  She has not had any lower extremity edema, orthopnea, or PND.  Her legs  sometimes ache but never when walking.   Past Medical History:  Diagnosis Date  . Atypical chest pain 11/08/2020  . Frozen shoulder left   did PT  . Osteopenia   . Palpitations 11/08/2020  . Pure hypercholesterolemia 11/08/2020    Past Surgical History:  Procedure Laterality Date  . CHOLECYSTECTOMY  07/2006  . CYSTECTOMY Left    paratubal cyst removal     Current Outpatient Medications  Medication Sig Dispense Refill  . VITAMIN D PO Take by mouth.     No current facility-administered medications for this visit.    Allergies:   Compazine [prochlorperazine], Minocin [minocycline], Naproxen, Prednisone, and Sulfa antibiotics    Social History:  The patient  reports that she has quit smoking. She has never used smokeless tobacco. She reports that she does not drink alcohol and does not use drugs.   Family History:  The patient's family history includes Congestive Heart Failure in her father; Diabetes in her brother, brother, and sister; Heart attack in her brother, mother, and sister; Heart attack (age of onset: 63) in her brother; Heart disease in her sister; Hypertension in her father; Peripheral Artery Disease in her brother and mother; Pulmonary Hypertension in her brother; Stroke in her father; Valvular heart disease in her mother.  ROS:  Please see the history of present illness.   Otherwise, review of systems are positive for none.   All other systems are reviewed and negative.    PHYSICAL EXAM: VS:  BP 128/82 (BP Location: Right Arm)   Pulse (!) 105   Ht 5' (1.524 m)   Wt 186 lb 3.2 oz (84.5 kg)   LMP 11/16/2012   BMI 36.36 kg/m  , BMI Body mass index is 36.36 kg/m. GENERAL:  Well appearing HEENT:  Pupils equal round and reactive, fundi not visualized, oral mucosa unremarkable NECK:  No jugular venous distention, waveform within normal limits, carotid upstroke brisk and symmetric, no bruits, no thyromegaly LYMPHATICS:  No cervical adenopathy LUNGS:  Clear to  auscultation bilaterally HEART:  RRR.  PMI not displaced or sustained,S1 and S2 within normal limits, no S3, no S4, no clicks, no rubs, no murmurs ABD:  Flat, positive bowel sounds normal in frequency in pitch, no bruits, no rebound, no guarding, no midline pulsatile mass, no hepatomegaly, no splenomegaly EXT:  2 plus pulses throughout, no edema, no cyanosis no clubbing SKIN:  No rashes no nodules NEURO:  Cranial nerves II through XII grossly intact, motor grossly intact throughout PSYCH:  Cognitively intact, oriented to person place and time   EKG:  EKG is ordered today. The ekg ordered today demonstrates Sinus tachycardia.  Rate 105 bpm.   Recent Labs: 09/13/2020: ALT 30; BUN 19; Creatinine, Ser 0.82; Hemoglobin 14.8; Platelets 221; Potassium 4.2; Sodium 141 10/08/2020: TSH 1.400    Lipid Panel    Component Value Date/Time   CHOL 217 (H) 09/13/2020 1025   TRIG 90 09/13/2020 1025   HDL 61 09/13/2020 1025   CHOLHDL 3.6 09/13/2020 1025   CHOLHDL 2.9 12/25/2016 1413   VLDL 21 12/25/2016 1413   LDLCALC 140 (H) 09/13/2020 1025      Wt Readings from Last 3 Encounters:  10/08/20 186 lb 3.2 oz (84.5 kg)  09/13/20 185 lb (83.9 kg)  07/12/19 168 lb (76.2 kg)      ASSESSMENT AND PLAN:  # Palpitations: Symptoms are very intermittent and seem to occur less than once per month.  We discussed getting an ambulatory monitor.  She will let us know if it happens more frequently so that we can get 1.  She is very against any medications.  We will check a TSH.  CBC and CMP were within normal limits with her PCP.  # Atypical chest pain: # Hyperlipidemia: Symptoms are very atypical.  She never has exertional symptoms and does walk for exercise and do yard work.  The likelihood of obstructive disease is low.  She does have family history of premature CAD.  We discussed getting a coronary calcium score.  She is going to think about this and get back to [place].  She is very hesitant to take any kind  of cholesterol medications due to her family not doing well with medicines in the past.  We did discuss nonpharmacologic ways to lower her cholesterol like increasing her exercise to least 150 minutes.  She is also going to limit fried foods and progress her diet from vegetarian towards vegan.  We will repeat her lipids in 4 months.  Current medicines are reviewed at length with the patient today.  The patient does not have concerns regarding medicines.  The following changes have been made:  no change  Labs/ tests ordered today include:   Orders Placed This Encounter  Procedures  . TSH  . Lipid  panel  . Comprehensive metabolic panel  . EKG 12-Lead     Disposition:   FU with Zasha Belleau C. Oval Linsey, MD, [facility] in 1 year    Signed, Darlean Warmoth C. Oval Linsey, MD, [facility]  11/08/2020 5:47 PM    [place]

## 2020-10-09 LAB — TSH: TSH: 1.4 u[IU]/mL (ref 0.450–4.500)

## 2020-11-08 ENCOUNTER — Encounter: Payer: Self-pay | Admitting: Cardiovascular Disease

## 2020-11-08 DIAGNOSIS — R002 Palpitations: Secondary | ICD-10-CM | POA: Insufficient documentation

## 2020-11-08 DIAGNOSIS — E7800 Pure hypercholesterolemia, unspecified: Secondary | ICD-10-CM

## 2020-11-08 DIAGNOSIS — R0789 Other chest pain: Secondary | ICD-10-CM

## 2020-11-08 HISTORY — DX: Pure hypercholesterolemia, unspecified: E78.00

## 2020-11-08 HISTORY — DX: Palpitations: R00.2

## 2020-11-08 HISTORY — DX: Other chest pain: R07.89

## 2020-11-14 ENCOUNTER — Other Ambulatory Visit: Payer: Self-pay | Admitting: Obstetrics & Gynecology

## 2020-11-14 DIAGNOSIS — M8580 Other specified disorders of bone density and structure, unspecified site: Secondary | ICD-10-CM

## 2021-01-06 ENCOUNTER — Ambulatory Visit: Payer: BC Managed Care – PPO

## 2021-01-06 ENCOUNTER — Other Ambulatory Visit: Payer: BC Managed Care – PPO

## 2021-03-05 ENCOUNTER — Other Ambulatory Visit: Payer: BC Managed Care – PPO

## 2021-03-05 ENCOUNTER — Other Ambulatory Visit: Payer: Self-pay

## 2021-03-05 ENCOUNTER — Ambulatory Visit
Admission: RE | Admit: 2021-03-05 | Discharge: 2021-03-05 | Disposition: A | Discharge status: Home / Self Care | Payer: BC Managed Care – PPO | Source: Ambulatory Visit | Attending: Obstetrics & Gynecology | Admitting: Obstetrics & Gynecology

## 2021-03-05 DIAGNOSIS — Z1231 Encounter for screening mammogram for malignant neoplasm of breast: Secondary | ICD-10-CM

## 2021-03-19 ENCOUNTER — Other Ambulatory Visit: Payer: BC Managed Care – PPO

## 2021-03-19 ENCOUNTER — Telehealth (HOSPITAL_BASED_OUTPATIENT_CLINIC_OR_DEPARTMENT_OTHER): Payer: Self-pay

## 2021-03-19 NOTE — Telephone Encounter (Signed)
Patient called to let us know that Evelyn Bright has called and cancelled her bone density 3 times in the last 6 months. She finally got in contact with the manager who assured her that they would get her in by Monday as this rescheduling is totally unexceptable.  She said if she is not scheduled by Monday that she may have to be referred some where else. tbw

## 2021-03-20 NOTE — Telephone Encounter (Signed)
Called patient to let her know if she does not get an appointment with [facility] imaging by Monday that she can give [place] a call back and Dr. Sabra Heck will be more than happy to schedule her exam at another location. LMOM at 9:07. tbw

## 2021-03-24 ENCOUNTER — Ambulatory Visit
Admission: RE | Admit: 2021-03-24 | Discharge: 2021-03-24 | Disposition: A | Discharge status: Home / Self Care | Payer: BC Managed Care – PPO | Source: Ambulatory Visit | Attending: Obstetrics & Gynecology | Admitting: Obstetrics & Gynecology

## 2021-03-24 ENCOUNTER — Other Ambulatory Visit: Payer: Self-pay

## 2021-03-24 ENCOUNTER — Other Ambulatory Visit: Payer: BC Managed Care – PPO

## 2021-03-24 DIAGNOSIS — M8580 Other specified disorders of bone density and structure, unspecified site: Secondary | ICD-10-CM

## 2021-03-24 DIAGNOSIS — M8589 Other specified disorders of bone density and structure, multiple sites: Secondary | ICD-10-CM | POA: Diagnosis not present

## 2021-03-24 DIAGNOSIS — Z780 Asymptomatic menopausal state: Secondary | ICD-10-CM | POA: Diagnosis not present

## 2021-06-01 DIAGNOSIS — Z20822 Contact with and (suspected) exposure to covid-19: Secondary | ICD-10-CM | POA: Diagnosis not present

## 2021-09-16 ENCOUNTER — Ambulatory Visit (HOSPITAL_BASED_OUTPATIENT_CLINIC_OR_DEPARTMENT_OTHER): Payer: BC Managed Care – PPO | Admitting: Obstetrics & Gynecology

## 2021-09-24 ENCOUNTER — Ambulatory Visit (INDEPENDENT_AMBULATORY_CARE_PROVIDER_SITE_OTHER): Payer: BC Managed Care – PPO | Admitting: Obstetrics & Gynecology

## 2021-09-24 ENCOUNTER — Other Ambulatory Visit: Payer: Self-pay

## 2021-09-24 ENCOUNTER — Encounter (HOSPITAL_BASED_OUTPATIENT_CLINIC_OR_DEPARTMENT_OTHER): Payer: Self-pay | Admitting: Obstetrics & Gynecology

## 2021-09-24 VITALS — BP 143/86 | HR 92 | Ht 62.0 in | Wt 175.0 lb

## 2021-09-24 DIAGNOSIS — N8110 Cystocele, unspecified: Secondary | ICD-10-CM

## 2021-09-24 DIAGNOSIS — E559 Vitamin D deficiency, unspecified: Secondary | ICD-10-CM

## 2021-09-24 DIAGNOSIS — Z780 Asymptomatic menopausal state: Secondary | ICD-10-CM | POA: Diagnosis not present

## 2021-09-24 DIAGNOSIS — Z01419 Encounter for gynecological examination (general) (routine) without abnormal findings: Secondary | ICD-10-CM | POA: Diagnosis not present

## 2021-09-24 DIAGNOSIS — Z1231 Encounter for screening mammogram for malignant neoplasm of breast: Secondary | ICD-10-CM

## 2021-09-24 DIAGNOSIS — Z0000 Encounter for general adult medical examination without abnormal findings: Secondary | ICD-10-CM

## 2021-09-24 NOTE — Progress Notes (Signed)
65 y.o. G2P2 Widowed White or Caucasian female here for annual exam.  Doing well.  Daughter still has significant OCD and is struggling.  Pt is concerned because daughter will not get help.  They did go to [employer] in July.  Pt is still looking for a job.  Has been able to draw widow's benefit so that has been helpful.    Has palpitations intermittently.  Saw Dr. Oval Linsey.  Coronary CT was discussed.  We discussed again today.  She did have some questions about this.    Denies vaginal bleeding.  Patient's last menstrual period was 11/16/2012.          Sexually active: No.  The current method of family planning is post menopausal status.    Exercising: walking Smoker:  no  Health Maintenance: Pap:  09/13/2020 Negative History of abnormal Pap:  no MMG:  03/05/2021 Negative Colonoscopy:  cologuard neg 2020 BMD:   03/24/2021 Screening Labs: ordered   reports that she has quit smoking. She has never used smokeless tobacco. She reports that she does not drink alcohol and does not use drugs.  Past Medical History:  Diagnosis Date   Atypical chest pain 11/08/2020   Frozen shoulder left   did PT   Osteopenia    Palpitations 11/08/2020   Pure hypercholesterolemia 11/08/2020    Past Surgical History:  Procedure Laterality Date   CHOLECYSTECTOMY  07/2006   CYSTECTOMY Left    paratubal cyst removal    Current Outpatient Medications  Medication Sig Dispense Refill   VITAMIN D PO Take by mouth.     No current facility-administered medications for this visit.    Family History  Problem Relation Age of Onset   Diabetes Sister    Heart disease Sister    Diabetes Brother    Pulmonary Hypertension Brother    Heart attack Brother 60   Peripheral Artery Disease Brother    Heart attack Brother        12 heart attacks and open heart surgery   Diabetes Brother    Heart attack Mother        heart surgery/staph infection   Peripheral Artery Disease Mother    Valvular heart disease  Mother    Heart attack Sister        open heart surgery   Congestive Heart Failure Father    Stroke Father    Hypertension Father    Breast cancer Neg Hx     Review of Systems  All other systems reviewed and are negative.  Exam:   BP (!) 143/86 (BP Location: Left Arm, Patient Position: Sitting, Cuff Size: Large)   Pulse 92   Ht 5\' 2"  (1.575 m)   Wt 175 lb (79.4 kg)   LMP 11/16/2012   BMI 32.01 kg/m   Height: 5\' 2"  (157.5 cm)  General appearance: alert, cooperative and appears stated age Head: Normocephalic, without obvious abnormality, atraumatic Neck: no adenopathy, supple, symmetrical, trachea midline and thyroid normal to inspection and palpation Lungs: clear to auscultation bilaterally Breasts: normal appearance, no masses or tenderness Heart: regular rate and rhythm Abdomen: soft, non-tender; bowel sounds normal; no masses,  no organomegaly Extremities: extremities normal, atraumatic, no cyanosis or edema Skin: Skin color, texture, turgor normal. No rashes or lesions Lymph nodes: Cervical, supraclavicular, and axillary nodes normal. No abnormal inguinal nodes palpated Neurologic: Grossly normal   Pelvic: External genitalia:  no lesions              Urethra:  normal  appearing urethra with no masses, tenderness or lesions              Bartholins and Skenes: normal                 Vagina: normal appearing vagina with normal color and no discharge, no lesions, mild cystocele noted              Cervix: no lesions              Pap taken: No. Bimanual Exam:  Uterus:  normal size, contour, position, consistency, mobility, non-tender              Adnexa: normal adnexa and no mass, fullness, tenderness               Rectovaginal: Confirms               Anus:  normal sphincter tone, no lesions  Chaperone, Octaviano Batty, CMA, was present for exam.  Assessment/Plan: 1. Well woman exam with routine gynecological exam - pap neg  with neg HR HPV 2021 - MMG 02/2021 - cologuard  negative 2020.  Pt thinks she will do a colonoscopy next year. - BMD 03/2021 - care gaps/vaccines reviewed and updated  2. Blood tests for routine general physical examination - CBC - Comprehensive metabolic panel - TSH - VITAMIN D 25 Hydroxy (Vit-D Deficiency, Fractures) - Lipid panel  3. Postmenopausal - no HRT  4. Female cystocele  5. Vitamin D deficiency  6. Encounter for screening mammogram for malignant neoplasm of breast - MM 3D SCREEN BREAST BILATERAL; Future

## 2021-09-25 LAB — LIPID PANEL
Chol/HDL Ratio: 3.4 ratio (ref 0.0–4.4)
Cholesterol, Total: 219 mg/dL — ABNORMAL HIGH (ref 100–199)
HDL: 65 mg/dL (ref >39)
LDL Chol Calc (NIH): 136 mg/dL — ABNORMAL HIGH (ref 0–99)
Triglycerides: 100 mg/dL (ref 0–149)
VLDL Cholesterol Cal: 18 mg/dL (ref 5–40)

## 2021-09-25 LAB — COMPREHENSIVE METABOLIC PANEL
ALT: 20 IU/L (ref 0–32)
AST: 21 IU/L (ref 0–40)
Albumin/Globulin Ratio: 1.6 (ref 1.2–2.2)
Albumin: 4.6 g/dL (ref 3.8–4.8)
Alkaline Phosphatase: 104 IU/L (ref 44–121)
BUN/Creatinine Ratio: 21 (ref 12–28)
BUN: 15 mg/dL (ref 8–27)
Bilirubin Total: 0.4 mg/dL (ref 0.0–1.2)
CO2: 26 mmol/L (ref 20–29)
Calcium: 9.8 mg/dL (ref 8.7–10.3)
Chloride: 101 mmol/L (ref 96–106)
Creatinine, Ser: 0.72 mg/dL (ref 0.57–1.00)
Globulin, Total: 2.8 g/dL (ref 1.5–4.5)
Glucose: 92 mg/dL (ref 70–99)
Potassium: 4.3 mmol/L (ref 3.5–5.2)
Sodium: 138 mmol/L (ref 134–144)
Total Protein: 7.4 g/dL (ref 6.0–8.5)
eGFR: 93 mL/min/{1.73_m2} (ref >59)

## 2021-09-25 LAB — CBC
Hematocrit: 45.5 % (ref 34.0–46.6)
Hemoglobin: 15.4 g/dL (ref 11.1–15.9)
MCH: 29.8 pg (ref 26.6–33.0)
MCHC: 33.8 g/dL (ref 31.5–35.7)
MCV: 88 fL (ref 79–97)
Platelets: 215 10*3/uL (ref 150–450)
RBC: 5.16 x10E6/uL (ref 3.77–5.28)
RDW: 12.1 % (ref 11.7–15.4)
WBC: 5.8 10*3/uL (ref 3.4–10.8)

## 2021-09-25 LAB — VITAMIN D 25 HYDROXY (VIT D DEFICIENCY, FRACTURES): Vit D, 25-Hydroxy: 34.5 ng/mL (ref 30.0–100.0)

## 2021-09-25 LAB — TSH: TSH: 2.28 u[IU]/mL (ref 0.450–4.500)

## 2022-04-07 ENCOUNTER — Ambulatory Visit (INDEPENDENT_AMBULATORY_CARE_PROVIDER_SITE_OTHER): Payer: BC Managed Care – PPO | Admitting: Nurse Practitioner

## 2022-04-07 ENCOUNTER — Encounter (HOSPITAL_BASED_OUTPATIENT_CLINIC_OR_DEPARTMENT_OTHER): Payer: Self-pay | Admitting: Nurse Practitioner

## 2022-04-07 VITALS — BP 117/72 | HR 68 | Ht 63.0 in | Wt 175.0 lb

## 2022-04-07 DIAGNOSIS — Z0000 Encounter for general adult medical examination without abnormal findings: Secondary | ICD-10-CM

## 2022-04-07 DIAGNOSIS — E7800 Pure hypercholesterolemia, unspecified: Secondary | ICD-10-CM | POA: Diagnosis not present

## 2022-04-07 DIAGNOSIS — R002 Palpitations: Secondary | ICD-10-CM | POA: Diagnosis not present

## 2022-04-07 NOTE — Patient Instructions (Addendum)
Thank you for choosing [facility] for your Primary Care needs. I am excited for the opportunity to partner with you to meet your health care goals. It was a pleasure meeting you today!  Recommendations from today's visit: I have reviewed your labs from Dr. Sabra Heck and things look very good. We will recheck these.   Information on diet, exercise, and health maintenance recommendations are listed below. This is information to help you be sure you are on track for optimal health and monitoring.   Please look over this and let us know if you have any questions or if you have completed any of the health maintenance outside of [place] so that we can be sure your records are up to date.  ___________________________________________________________ About Me: I am an Adult-Geriatric Nurse Practitioner with a background in caring for patients for more than 20 years with a strong intensive care background. I provide primary care and sports medicine services to patients age 65 and older within this office. My education had a strong focus on caring for the older adult population, which I am passionate about. I am also the director of the APP Fellowship with [facility].   My desire is to provide you with the best service through preventive medicine and supportive care. I consider you a part of the medical team and value your input. I work diligently to ensure that you are heard and your needs are met in a safe and effective manner. I want you to feel comfortable with me as your provider and want you to know that your health concerns are important to me.  For your information, our office hours are: Monday, Tuesday, and Thursday 8:00 AM - 5:00 PM Wednesday and Friday 8:00 AM - 12:00 PM.   In my time away from the office I am teaching new APP's within the system and am unavailable, but my partner, Dr. Burnard Bunting is in the office for emergent needs.   If you have questions or  concerns, please call our office at [phone] or send [place] a MyChart message and we will respond as quickly as possible.  ____________________________________________________________ MyChart:  For all urgent or time sensitive needs we ask that you please call the office to avoid delays. Our number is [phone]. MyChart is not constantly monitored and due to the large volume of messages a day, replies may take up to 72 business hours.  MyChart Policy: MyChart allows for you to see your visit notes, after visit summary, provider recommendations, lab and tests results, make an appointment, request refills, and contact your provider or the office for non-urgent questions or concerns. Providers are seeing patients during normal business hours and do not have built in time to review MyChart messages.  We ask that you allow a minimum of 3 business days for responses to [employer]. For this reason, please do not send urgent requests through [place]. Please call the office at [phone]. New and ongoing conditions may require a visit. We have virtual and in person visit available for your convenience.  Complex MyChart concerns may require a visit. Your provider may request you schedule a virtual or in person visit to ensure we are providing the best care possible. MyChart messages sent after 11:00 AM on Friday will not be received by the provider until Monday morning.    Lab and Test Results: You will receive your lab and test results on MyChart as soon as they are completed and results have  been sent by the lab or testing facility. Due to this service, you will receive your results BEFORE your provider.  I review lab and tests results each morning prior to seeing patients. Some results require collaboration with other providers to ensure you are receiving the most appropriate care. For this reason, we ask that you please allow a minimum of 3-5 business days from the time the ALL results have  been received for your provider to receive and review lab and test results and contact you about these.  Most lab and test result comments from the provider will be sent through [place]. Your provider may recommend changes to the plan of care, follow-up visits, repeat testing, ask questions, or request an office visit to discuss these results. You may reply directly to this message or call the office at [phone] to provide information for the provider or set up an appointment. In some instances, you will be called with test results and recommendations. Please let us know if this is preferred and we will make note of this in your chart to provide this for you.    If you have not heard a response to your lab or test results in 5 business days from all results returning to [place], please call the office to let us know. We ask that you please avoid calling prior to this time unless there is an emergent concern. Due to high call volumes, this can delay the resulting process.  After Hours: For all non-emergency after hours needs, please call the office at [phone] and select the option to reach the on-call provider service. On-call services are shared between multiple [place] offices and therefore it will not be possible to speak directly with your provider. On-call providers may provide medical advice and recommendations, but are unable to provide refills for maintenance medications.  For all emergency or urgent medical needs after normal business hours, we recommend that you seek care at the closest Urgent Care or Emergency Department to ensure appropriate treatment in a timely manner.  [facility] [facility] [place] has a 24 hour emergency room located on the ground floor for your convenience.   Urgent Concerns During the Business Day Providers are seeing patients from 8AM to [place] with a busy schedule and are most often not able to respond to non-urgent calls until the end of the day or the  next business day. If you should have URGENT concerns during the day, please call and speak to the nurse or schedule a same day appointment so that we can address your concern without delay.   Thank you, again, for choosing me as your health care partner. I appreciate your trust and look forward to learning more about you.   Worthy Keeler, DNP, AGNP-c ___________________________________________________________  Health Maintenance Recommendations Screening Testing Mammogram Every 1 -2 years based on history and risk factors Starting at age 104 Pap Smear Ages 21-39 every 3 years Ages 18-65 every 5 years with HPV testing More frequent testing may be required based on results and history Colon Cancer Screening Every 1-10 years based on test performed, risk factors, and history Starting at age 54 Bone Density Screening Every 2-10 years based on history Starting at age 73 for women Recommendations for men differ based on medication usage, history, and risk factors AAA Screening One time ultrasound Men 31-74 years old who have every smoked Lung Cancer Screening Low Dose Lung CT every 12 months Age 3-80 years with a 30 pack-year smoking history who still  smoke or who have quit within the last 15 years  Screening Labs Routine  Labs: Complete Blood Count (CBC), Complete Metabolic Panel (CMP), Cholesterol (Lipid Panel) Every 6-12 months based on history and medications May be recommended more frequently based on current conditions or previous results Hemoglobin A1c Lab Every 3-12 months based on history and previous results Starting at age 6 or earlier with diagnosis of diabetes, high cholesterol, BMI >26, and/or risk factors Frequent monitoring for patients with diabetes to ensure blood sugar control Thyroid Panel (TSH w/ T3 & T4) Every 6 months based on history, symptoms, and risk factors May be repeated more often if on medication HIV One time testing for all patients 25 and  older May be repeated more frequently for patients with increased risk factors or exposure Hepatitis C One time testing for all patients 45 and older May be repeated more frequently for patients with increased risk factors or exposure Gonorrhea, Chlamydia Every 12 months for all sexually active persons 13-24 years Additional monitoring may be recommended for those who are considered high risk or who have symptoms PSA Men 82-9 years old with risk factors Additional screening may be recommended from age 63-69 based on risk factors, symptoms, and history  Vaccine Recommendations Tetanus Booster All adults every 10 years Flu Vaccine All patients 6 months and older every year COVID Vaccine All patients 12 years and older Initial dosing with booster May recommend additional booster based on age and health history HPV Vaccine 2 doses all patients age 59-26 Dosing may be considered for patients over 26 Shingles Vaccine (Shingrix) 2 doses all adults 40 years and older Pneumonia (Pneumovax 23) All adults 38 years and older May recommend earlier dosing based on health history Pneumonia (Prevnar 76) All adults 36 years and older Dosed 1 year after Pneumovax 23  Additional Screening, Testing, and Vaccinations may be recommended on an individualized basis based on family history, health history, risk factors, and/or exposure.  __________________________________________________________  Diet Recommendations for All Patients  I recommend that all patients maintain a diet low in saturated fats, carbohydrates, and cholesterol. While this can be challenging at first, it is not impossible and small changes can make big differences.  Things to try: Decreasing the amount of soda, sweet tea, and/or juice to one or less per day and replace with water While water is always the first choice, if you do not like water you may consider adding a water additive without sugar to improve the taste other  sugar free drinks Replace potatoes with a brightly colored vegetable at dinner Use healthy oils, such as canola oil or olive oil, instead of butter or hard margarine Limit your bread intake to two pieces or less a day Replace regular pasta with low carb pasta options Bake, broil, or grill foods instead of frying Monitor portion sizes  Eat smaller, more frequent meals throughout the day instead of large meals  An important thing to remember is, if you love foods that are not great for your health, you don't have to give them up completely. Instead, allow these foods to be a reward when you have done well. Allowing yourself to still have special treats every once in a while is a nice way to tell yourself thank you for working hard to keep yourself healthy.   Also remember that every day is a new day. If you have a bad day and "fall off the wagon", you can still climb right back up and keep moving along on  your journey!  We have resources available to help you!  Some websites that may be helpful include: www.http://carter.biz/  Www.VeryWellFit.com _____________________________________________________________  Activity Recommendations for All Patients  I recommend that all adults get at least 20 minutes of moderate physical activity that elevates your heart rate at least 5 days out of the week.  Some examples include: Walking or jogging at a pace that allows you to carry on a conversation Cycling (stationary bike or outdoors) Water aerobics Yoga Weight lifting Dancing If physical limitations prevent you from putting stress on your joints, exercise in a pool or seated in a chair are excellent options.  Do determine your MAXIMUM heart rate for activity: YOUR AGE - 220 = MAX HeartRate   Remember! Do not push yourself too hard.  Start slowly and build up your pace, speed, weight, time in exercise, etc.  Allow your body to rest between exercise and get good sleep. You will need more water than  normal when you are exerting yourself. Do not wait until you are thirsty to drink. Drink with a purpose of getting in at least 8, 8 ounce glasses of water a day plus more depending on how much you exercise and sweat.    If you begin to develop dizziness, chest pain, abdominal pain, jaw pain, shortness of breath, headache, vision changes, lightheadedness, or other concerning symptoms, stop the activity and allow your body to rest. If your symptoms are severe, seek emergency evaluation immediately. If your symptoms are concerning, but not severe, please let us know so that we can recommend further evaluation.

## 2022-04-07 NOTE — Progress Notes (Signed)
Orma Render, DNP, AGNP-c Primary Care & Sports Medicine [address]  [place], [place] [address] [phone]  New patient visit   Patient: Evelyn Bright   DOB: 1956-01-07   66 y.o. Female  MRN: [id number] Visit Date: 04/07/2022  Patient Care Team: Orma Render, NP as PCP - General (Nurse Practitioner)  Today's Vitals   04/07/22 1432  BP: 117/72  Pulse: 68  SpO2: 96%  Weight: 175 lb (79.4 kg)  Height: '5\' 3"'$  (1.6 m)   Body mass index is 31 kg/m.   Today's healthcare provider: Orma Render, NP   Chief Complaint  Patient presents with  . New Patient (Initial Visit)    Patient presents to establish care, no other concerns.    Subjective    Evelyn Bright is a 66 y.o. female who presents today as a new patient to establish care.    Patient endorses the following concerns presently: Prefers to stay away from medications.  She is a vegetarian and eats no forms of meat She does eat vegetables and fresh fruit  Works to get the iron reach food and protein Drinks water and fat free skim milk  Occasionally having palpitations No passing out, dizziness.   History reviewed and reveals the following: Past Medical History:  Diagnosis Date  . Atypical chest pain 11/08/2020  . Frozen shoulder left   did PT  . Osteopenia   . Palpitations 11/08/2020  . Pure hypercholesterolemia 11/08/2020   Past Surgical History:  Procedure Laterality Date  . CHOLECYSTECTOMY  07/2006  . CYSTECTOMY Left    paratubal cyst removal   Family Status  Relation Name Status  . Sister  Deceased  . Brother  Deceased       liver disease  . Brother  (Not Specified)  . Brother  (Not Specified)  . Brother  (Not Specified)  . Mother  Deceased  . Sister  (Not Specified)  . Father  Deceased  . Neg Hx  (Not Specified)   Family History  Problem Relation Age of Onset  . Diabetes Sister   . Heart disease Sister   . Diabetes Brother   . Pulmonary Hypertension  Brother   . Heart attack Brother 36  . Peripheral Artery Disease Brother   . Heart attack Brother        12 heart attacks and open heart surgery  . Diabetes Brother   . Heart attack Mother        heart surgery/staph infection  . Peripheral Artery Disease Mother   . Valvular heart disease Mother   . Heart attack Sister        open heart surgery  . Congestive Heart Failure Father   . Stroke Father   . Hypertension Father   . Breast cancer Neg Hx    Social History   Socioeconomic History  . Marital status: Widowed    Spouse name: Not on file  . Number of children: 2  . Years of education: Not on file  . Highest education level: Not on file  Occupational History  . Not on file  Tobacco Use  . Smoking status: Former  . Smokeless tobacco: Never  . Tobacco comments:    age [age]  Vaping Use  . Vaping Use: Never used  Substance and Sexual Activity  . Alcohol use: No  . Drug use: No  . Sexual activity: Not Currently    Partners: Male    Birth control/protection: Post-menopausal  Other Topics Concern  . Not on file  Social History Narrative  . Not on file   Social Determinants of Health   Financial Resource Strain: Not on file  Food Insecurity: Not on file  Transportation Needs: Not on file  Physical Activity: Not on file  Stress: Not on file  Social Connections: Not on file   Outpatient Medications Prior to Visit  Medication Sig  . VITAMIN D PO Take by mouth.   No facility-administered medications prior to visit.   Allergies  Allergen Reactions  . Compazine [Prochlorperazine]   . Minocin [Minocycline]   . Naproxen Rash  . Prednisone Nausea Only and Rash  . Sulfa Antibiotics Rash    There is no immunization history on file for this patient.  Review of Systems All review of systems negative except what is listed in the HPI   Objective    BP 117/72   Pulse 68   Ht '5\' 3"'$  (1.6 m)   Wt 175 lb (79.4 kg)   LMP 11/16/2012   SpO2 96%   BMI 31.00 kg/m   Physical Exam  No results found for any visits on 04/07/22.  Assessment & Plan      Problem List Items Addressed This Visit   None    No follow-ups on file.      Maimouna Rondeau, [name], NP, DNP, AGNP-C Primary Care & [facility] Medicine at [place]

## 2022-04-08 ENCOUNTER — Other Ambulatory Visit: Payer: Self-pay | Admitting: Obstetrics & Gynecology

## 2022-04-08 ENCOUNTER — Other Ambulatory Visit (HOSPITAL_BASED_OUTPATIENT_CLINIC_OR_DEPARTMENT_OTHER): Payer: Self-pay | Admitting: Obstetrics & Gynecology

## 2022-04-08 DIAGNOSIS — Z1231 Encounter for screening mammogram for malignant neoplasm of breast: Secondary | ICD-10-CM

## 2022-04-08 LAB — CBC WITH DIFFERENTIAL/PLATELET
Basophils Absolute: 0 10*3/uL (ref 0.0–0.2)
Basos: 1 %
EOS (ABSOLUTE): 0 10*3/uL (ref 0.0–0.4)
Eos: 0 %
Hematocrit: 43.3 % (ref 34.0–46.6)
Hemoglobin: 15 g/dL (ref 11.1–15.9)
Immature Grans (Abs): 0 10*3/uL (ref 0.0–0.1)
Immature Granulocytes: 0 %
Lymphocytes Absolute: 1.4 10*3/uL (ref 0.7–3.1)
Lymphs: 22 %
MCH: 30.6 pg (ref 26.6–33.0)
MCHC: 34.6 g/dL (ref 31.5–35.7)
MCV: 88 fL (ref 79–97)
Monocytes Absolute: 0.5 10*3/uL (ref 0.1–0.9)
Monocytes: 8 %
Neutrophils Absolute: 4.4 10*3/uL (ref 1.4–7.0)
Neutrophils: 69 %
Platelets: 220 10*3/uL (ref 150–450)
RBC: 4.9 x10E6/uL (ref 3.77–5.28)
RDW: 12.5 % (ref 11.7–15.4)
WBC: 6.4 10*3/uL (ref 3.4–10.8)

## 2022-04-08 LAB — COMPREHENSIVE METABOLIC PANEL
ALT: 15 IU/L (ref 0–32)
AST: 17 IU/L (ref 0–40)
Albumin/Globulin Ratio: 1.4 (ref 1.2–2.2)
Albumin: 4.5 g/dL (ref 3.8–4.8)
Alkaline Phosphatase: 93 IU/L (ref 44–121)
BUN/Creatinine Ratio: 21 (ref 12–28)
BUN: 14 mg/dL (ref 8–27)
Bilirubin Total: 0.5 mg/dL (ref 0.0–1.2)
CO2: 24 mmol/L (ref 20–29)
Calcium: 9.7 mg/dL (ref 8.7–10.3)
Chloride: 102 mmol/L (ref 96–106)
Creatinine, Ser: 0.66 mg/dL (ref 0.57–1.00)
Globulin, Total: 3.2 g/dL (ref 1.5–4.5)
Glucose: 97 mg/dL (ref 70–99)
Potassium: 4.4 mmol/L (ref 3.5–5.2)
Sodium: 140 mmol/L (ref 134–144)
Total Protein: 7.7 g/dL (ref 6.0–8.5)
eGFR: 98 mL/min/{1.73_m2} (ref >59)

## 2022-04-08 LAB — LIPID PANEL
Chol/HDL Ratio: 3.1 ratio (ref 0.0–4.4)
Cholesterol, Total: 210 mg/dL — ABNORMAL HIGH (ref 100–199)
HDL: 68 mg/dL (ref >39)
LDL Chol Calc (NIH): 125 mg/dL — ABNORMAL HIGH (ref 0–99)
Triglycerides: 96 mg/dL (ref 0–149)
VLDL Cholesterol Cal: 17 mg/dL (ref 5–40)

## 2022-04-08 NOTE — Assessment & Plan Note (Signed)
Chronic.  Intermittent.  No alarm symptoms present today. HRRR on examination today with no neurological deficits noted.  Will obtain labs today for evaluation.  We will continue to monitor closely.  Patient aware to contact the office or seek emergency care if new or worsening symptoms develop.

## 2022-04-08 NOTE — Assessment & Plan Note (Signed)
Chronic.  Patient is working on diet control of this issue.  Will obtain labs today.  No alarm symptoms present at this time.  Patient aware of risks associated with high cholesterol levels however she does wish to stay away from medications.  Discussed healthy fats and consideration of omega-3 fatty acids to help reduce cholesterol levels.  We will monitor closely.

## 2022-04-09 ENCOUNTER — Ambulatory Visit
Admission: RE | Admit: 2022-04-09 | Discharge: 2022-04-09 | Disposition: A | Discharge status: Home / Self Care | Payer: BC Managed Care – PPO | Source: Ambulatory Visit | Attending: Obstetrics & Gynecology | Admitting: Obstetrics & Gynecology

## 2022-04-09 DIAGNOSIS — Z1231 Encounter for screening mammogram for malignant neoplasm of breast: Secondary | ICD-10-CM

## 2022-04-10 ENCOUNTER — Inpatient Hospital Stay (HOSPITAL_BASED_OUTPATIENT_CLINIC_OR_DEPARTMENT_OTHER): Admission: RE | Admit: 2022-04-10 | Payer: BC Managed Care – PPO | Source: Ambulatory Visit | Admitting: Radiology

## 2022-05-01 ENCOUNTER — Ambulatory Visit (HOSPITAL_BASED_OUTPATIENT_CLINIC_OR_DEPARTMENT_OTHER): Payer: BC Managed Care – PPO | Admitting: Nurse Practitioner

## 2022-06-29 ENCOUNTER — Telehealth: Payer: Self-pay | Admitting: Cardiovascular Disease

## 2022-06-29 NOTE — Telephone Encounter (Signed)
Returned call to patient. She states it is less chest pain and more a sensation. She endorses that she has been under a lot of stress lately and recently lost a friend all in the same week. Patient denies shortness of breath. Patient does state has just a small amount of dizziness. Pulse rate and o2 levels normal. Patient does not check her bp at home. No sweating, nausea, or vomiting.   Consulted with DOD, ok to bring patient in sooner than scheduled appointment. Patient rescheduled.

## 2022-06-29 NOTE — Telephone Encounter (Signed)
Pt states she has pressure in her chest not a chest pain and also has a pain going up the left side of her neck. Pt would callback regarding this matter. Please advise

## 2022-07-01 ENCOUNTER — Encounter (HOSPITAL_BASED_OUTPATIENT_CLINIC_OR_DEPARTMENT_OTHER): Payer: Self-pay | Admitting: Family

## 2022-07-01 ENCOUNTER — Ambulatory Visit (HOSPITAL_BASED_OUTPATIENT_CLINIC_OR_DEPARTMENT_OTHER): Payer: Medicare HMO | Admitting: Family

## 2022-07-01 VITALS — BP 126/84 | HR 110 | Ht 63.0 in | Wt 169.0 lb

## 2022-07-01 DIAGNOSIS — I2510 Atherosclerotic heart disease of native coronary artery without angina pectoris: Secondary | ICD-10-CM | POA: Diagnosis not present

## 2022-07-01 DIAGNOSIS — R42 Dizziness and giddiness: Secondary | ICD-10-CM | POA: Diagnosis not present

## 2022-07-01 DIAGNOSIS — R079 Chest pain, unspecified: Secondary | ICD-10-CM | POA: Diagnosis not present

## 2022-07-01 DIAGNOSIS — R002 Palpitations: Secondary | ICD-10-CM

## 2022-07-01 DIAGNOSIS — Z8249 Family history of ischemic heart disease and other diseases of the circulatory system: Secondary | ICD-10-CM

## 2022-07-01 NOTE — Patient Instructions (Addendum)
Medication Instructions:  Continue your current medications.   *If you need a refill on your cardiac medications before your next appointment, please call your pharmacy*  Lab Work: Your physician recommends that you return for lab work today- BMP, Mag, and TSH.   Testing/Procedures: Your physician has requested that you have a carotid duplex. This test is an ultrasound of the carotid arteries in your neck. It looks at blood flow through these arteries that supply the brain with blood. Allow one hour for this exam. There are no restrictions or special instructions.   Your physician has requested that you have a coronary calcium score. Cardiac computed tomography (CT) is a painless test that uses an x-ray machine to take clear, detailed pictures of your heart. Please follow instruction sheet as given.  Follow-Up: At [facility], you and your health needs are our priority.  As part of our continuing mission to provide you with exceptional heart care, we have created designated Provider Care Teams.  These Care Teams include your primary Cardiologist (physician) and Advanced Practice Providers (APPs -  Physician Assistants and Nurse Practitioners) who all work together to provide you with the care you need, when you need it.  We recommend signing up for the patient portal called "MyChart".  Sign up information is provided on this After Visit Summary.  MyChart is used to connect with patients for Virtual Visits (Telemedicine).  Patients are able to view lab/test results, encounter notes, upcoming appointments, etc.  Non-urgent messages can be sent to your provider as well.   To learn more about what you can do with MyChart, go to [email or web address].    Your next appointment:   2 month(s)  The format for your next appointment:   In Person  Provider:   Skeet Latch, MD or Laurann Montana, NP    Other Instructions  To prevent palpitations: Make sure you are adequately hydrated.   Avoid and/or limit caffeine containing beverages like soda or tea. Exercise regularly.  Manage stress well. Some over the counter medications can cause palpitations such as Benadryl, AdvilPM, TylenolPM. Regular Advil or Tylenol do not cause palpitations.

## 2022-07-01 NOTE — Progress Notes (Signed)
Office Visit    Patient Name: Evelyn Bright Date of Encounter: 07/04/2022  PCP:  Orma Render, NP   [place]  Cardiologist:  Skeet Latch, MD  Advanced Practice Provider:  No care team member to display Electrophysiologist:  None    Chief Complaint    Evelyn Bright is a 66 y.o. female presents today for  palpitations   Past Medical History    Past Medical History:  Diagnosis Date   Atypical chest pain 11/08/2020   Frozen shoulder left   did PT   Osteopenia    Palpitations 11/08/2020   Pure hypercholesterolemia 11/08/2020   Past Surgical History:  Procedure Laterality Date   CHOLECYSTECTOMY  07/2006   CYSTECTOMY Left    paratubal cyst removal    Allergies  Allergies  Allergen Reactions   Compazine [Prochlorperazine]    Minocin [Minocycline]    Naproxen Rash   Prednisone Nausea Only and Rash   Sulfa Antibiotics Rash    History of Present Illness    Evelyn Bright is a 66 y.o. female with a hx of HLD, palpitations last seen 10/08/2020 by Dr. Oval Linsey.  Established with Dr. Oval Linsey 09/2020 due to palpitations. As symptoms were intermittent less than once per month, monitor deferred. She was very hesitant regarding medications and declined. She had atypical chest discomfort though strong family history of CAD. She was offered coronary calcium score but wished to think about it.   Presents today for follow up independently. Tells me she has had the "flutters" for awhile. Notes on and off she will get a pressure sensation in her neck which is occurring more often. Also occasional chest pressure in her upper chest. Occurs at rest or with activity. Not usually when laying. No noted aggravating nor relieving factors. Did have a friend pass away this week and some other stressors which she notes may be triggering recent symptoms. Just took a trip to [place] last month where she did a lot of walking for 2 weeks.   Very strong family hx of CAD.  Mom with open heart surgery. Dad with CHF and CVA. Oldest brother with 12 heart attacks, 2 open heart surgery. Sister with opeen heart surgery. Other brother diabetic, open heart surgery. Other brother MI in his 76s and passed away of MI. She is hesitant regarding medications particularly as her family members had difficulty with cholesterol medication per her report.   EKGs/Labs/Other Studies Reviewed:   The following studies were reviewed today:  EKG:  EKG is ordered today.  The ekg ordered today demonstrates ST 110 bpm with no acute St/T wave changes  Recent Labs: 04/07/2022: ALT 15; Hemoglobin 15.0; Platelets 220 07/01/2022: BUN 18; Creatinine, Ser 0.79; Magnesium 2.6; Potassium 5.2; Sodium 140; TSH 1.140  Recent Lipid Panel    Component Value Date/Time   CHOL 210 (H) 04/07/2022 1540   TRIG 96 04/07/2022 1540   HDL 68 04/07/2022 1540   CHOLHDL 3.1 04/07/2022 1540   CHOLHDL 2.9 12/25/2016 1413   VLDL 21 12/25/2016 1413   LDLCALC 125 (H) 04/07/2022 1540   Home Medications   Current Meds  Medication Sig   VITAMIN D PO Take by mouth.     Review of Systems      All other systems reviewed and are otherwise negative except as noted above.  Physical Exam    VS:  BP 126/84   Pulse (!) 110   Ht '5\' 3"'$  (1.6 m)   Wt 169  lb (76.7 kg)   LMP 11/16/2012   BMI 29.94 kg/m  , BMI Body mass index is 29.94 kg/m.  Wt Readings from Last 3 Encounters:  07/01/22 169 lb (76.7 kg)  04/07/22 175 lb (79.4 kg)  09/24/21 175 lb (79.4 kg)     GEN: Well nourished, well developed, in no acute distress. HEENT: normal. Neck: Supple, no JVD, carotid bruits, or masses. Cardiac: RRR, no murmurs, rubs, or gallops. No clubbing, cyanosis, edema.  Radials/PT 2+ and equal bilaterally.  Respiratory:  Respirations regular and unlabored, clear to auscultation bilaterally. GI: Soft, nontender, nondistended. MS: No deformity or atrophy. Skin: Warm and dry, no rash. Neuro:  Strength and sensation are  intact. Psych: Normal affect.  Assessment & Plan    Palpitations - Politely declines medication. Update BMP, mag, TSH. Discussed triggers including recent stress. No caffeine, etoh.   Lightheadedness - Consider etiology orthostatic hypotension. Given sensation of "pulsing" in her neck, carotid duplex ordered.    Hyperlipidemia / Family history of heart disease / Chest pain in adult - Chest pain overall atypical as occurs at rest or with activity. Very strong family hx of CAD. 03/18/22 LDL 125. Coronary calcium score ordered. She prefers to avoid medications so politely declined cardiac CTA due to need for contrast. Offered PRN nitroglycerin which she wishes to think about.      Disposition: Follow up in 2 month(s) with Skeet Latch, MD or APP.  Signed, Loel Dubonnet, NP 07/04/2022, 2:52 PM [place]

## 2022-07-02 LAB — BASIC METABOLIC PANEL
BUN/Creatinine Ratio: 23 (ref 12–28)
BUN: 18 mg/dL (ref 8–27)
CO2: 22 mmol/L (ref 20–29)
Calcium: 10 mg/dL (ref 8.7–10.3)
Chloride: 102 mmol/L (ref 96–106)
Creatinine, Ser: 0.79 mg/dL (ref 0.57–1.00)
Glucose: 102 mg/dL — ABNORMAL HIGH (ref 70–99)
Potassium: 5.2 mmol/L (ref 3.5–5.2)
Sodium: 140 mmol/L (ref 134–144)
eGFR: 83 mL/min/{1.73_m2} (ref >59)

## 2022-07-02 LAB — MAGNESIUM: Magnesium: 2.6 mg/dL — ABNORMAL HIGH (ref 1.6–2.3)

## 2022-07-02 LAB — SPECIMEN STATUS REPORT

## 2022-07-02 LAB — TSH: TSH: 1.14 u[IU]/mL (ref 0.450–4.500)

## 2022-07-03 ENCOUNTER — Telehealth: Payer: Self-pay | Admitting: Cardiovascular Disease

## 2022-07-03 MED ORDER — NITROGLYCERIN 0.4 MG SL SUBL
0.4000 mg | SUBLINGUAL_TABLET | SUBLINGUAL | 4 refills | Status: DC | PRN
Start: 2022-07-03 — End: 2022-09-28

## 2022-07-03 NOTE — Addendum Note (Signed)
Addended by: Gerald Stabs on: 07/03/2022 01:07 PM   Modules accepted: Orders

## 2022-07-03 NOTE — Telephone Encounter (Addendum)
Returned call to patient and reviewed labs with patient, patient verbalizes understanding. Patient wanted to go ahead and have Nitro called in, RN ordered per Laurann Montana recent office note.    "Stable kidney function. Normal potassium. Magnesium mildly elevated, not of concern. Normal thyroid. Good result! "

## 2022-07-03 NOTE — Telephone Encounter (Signed)
Patient is requesting a call back to discuss lab results. 

## 2022-07-04 ENCOUNTER — Encounter (HOSPITAL_BASED_OUTPATIENT_CLINIC_OR_DEPARTMENT_OTHER): Payer: Self-pay | Admitting: Family

## 2022-07-10 ENCOUNTER — Telehealth: Payer: Self-pay | Admitting: Cardiovascular Disease

## 2022-07-10 NOTE — Telephone Encounter (Signed)
Pt calling because she'd like a call back about some of her upcoming tests

## 2022-07-10 NOTE — Telephone Encounter (Signed)
Returned call to patient, patient had some questions about her upcoming tests. Patient wants to know what these tests are going to do for her and wants to know why she has to wait so long to have them done. Reassured patient on reasoning for tests and explained to her that she is scheduled appropriately. Reeducated patient that she should take the Nitro as needed for chest pain and that she should be seen in ED for chest pain or passing out in the interim.

## 2022-07-15 ENCOUNTER — Ambulatory Visit (INDEPENDENT_AMBULATORY_CARE_PROVIDER_SITE_OTHER): Payer: Medicare HMO

## 2022-07-15 DIAGNOSIS — R002 Palpitations: Secondary | ICD-10-CM

## 2022-07-15 DIAGNOSIS — R42 Dizziness and giddiness: Secondary | ICD-10-CM

## 2022-07-17 ENCOUNTER — Telehealth (HOSPITAL_BASED_OUTPATIENT_CLINIC_OR_DEPARTMENT_OTHER): Payer: Self-pay

## 2022-07-17 NOTE — Telephone Encounter (Addendum)
Results called to patient who verbalizes understanding!    ----- Message from Loel Dubonnet, NP sent at 07/17/2022  9:51 AM EDT ----- Carotid arteries with no significant stenosis. Great result!

## 2022-07-24 NOTE — Progress Notes (Unsigned)
Office Visit    Patient Name: Evelyn Bright Date of Encounter: 07/24/2022  PCP:  Evelyn Render, NP   [place]  Cardiologist:  Skeet Latch, MD  Advanced Practice Provider:  No care team member to display Electrophysiologist:  None   Chief Complaint    Evelyn Bright is a 66 y.o. female presents today for ***  Past Medical History    Past Medical History:  Diagnosis Date   Atypical chest pain 11/08/2020   Frozen shoulder left   did PT   Osteopenia    Palpitations 11/08/2020   Pure hypercholesterolemia 11/08/2020   Past Surgical History:  Procedure Laterality Date   CHOLECYSTECTOMY  07/2006   CYSTECTOMY Left    paratubal cyst removal    Allergies  Allergies  Allergen Reactions   Compazine [Prochlorperazine]    Minocin [Minocycline]    Naproxen Rash   Prednisone Nausea Only and Rash   Sulfa Antibiotics Rash    History of Present Illness    Evelyn Bright is a 66 y.o. female with a hx of HLD, palpitations last seen 07/01/22.  Established with Dr. Oval Linsey 09/2020 due to palpitations. As symptoms were intermittent less than once per month, monitor deferred. She was very hesitant regarding medications and declined. She had atypical chest discomfort though strong family history of CAD. She was offered coronary calcium score but wished to think about it.   Seen 07/01/22 with occasional palpitations and chest pressure. Carotid duplex 07/15/21 no significant stenosis. Coronary calcium score 07/01/22 ******  Presents today for follow up independently. Tells me she has had the "flutters" for awhile. Notes on and off she will get a pressure sensation in her neck which is occurring more often. Also occasional chest pressure in her upper chest. Occurs at rest or with activity. Not usually when laying. No noted aggravating nor relieving factors. Did have a friend pass away this week and some other stressors which she notes may be triggering recent  symptoms. Just took a trip to [place] last month where she did a lot of walking for 2 weeks.   Very strong family hx of CAD. Mom with open heart surgery. Dad with CHF and CVA. Oldest brother with 12 heart attacks, 2 open heart surgery. Sister with open heart surgery. Other brother diabetic, open heart surgery. Other brother MI in his 60s and passed away of MI. She is hesitant regarding medications particularly as her family members had difficulty with cholesterol medication per her report.   ***  EKGs/Labs/Other Studies Reviewed:   The following studies were reviewed today:  EKG:  EKG is ordered today.  The ekg ordered today demonstrates ST 110 bpm with no acute St/T wave changes***  Recent Labs: 04/07/2022: ALT 15; Hemoglobin 15.0; Platelets 220 07/01/2022: BUN 18; Creatinine, Ser 0.79; Magnesium 2.6; Potassium 5.2; Sodium 140; TSH 1.140  Recent Lipid Panel    Component Value Date/Time   CHOL 210 (H) 04/07/2022 1540   TRIG 96 04/07/2022 1540   HDL 68 04/07/2022 1540   CHOLHDL 3.1 04/07/2022 1540   CHOLHDL 2.9 12/25/2016 1413   VLDL 21 12/25/2016 1413   LDLCALC 125 (H) 04/07/2022 1540   Home Medications   No outpatient medications have been marked as taking for the 07/29/22 encounter (Appointment) with Loel Dubonnet, NP.     Review of Systems      All other systems reviewed and are otherwise negative except as noted above.  Physical Exam  VS:  LMP 11/16/2012  , BMI There is no height or weight on file to calculate BMI.  Wt Readings from Last 3 Encounters:  07/01/22 169 lb (76.7 kg)  04/07/22 175 lb (79.4 kg)  09/24/21 175 lb (79.4 kg)    *** GEN: Well nourished, well developed, in no acute distress. HEENT: normal. Neck: Supple, no JVD, carotid bruits, or masses. Cardiac: RRR, no murmurs, rubs, or gallops. No clubbing, cyanosis, edema.  Radials/PT 2+ and equal bilaterally.  Respiratory:  Respirations regular and unlabored, clear to auscultation bilaterally. GI: Soft,  nontender, nondistended. MS: No deformity or atrophy. Skin: Warm and dry, no rash. Neuro:  Strength and sensation are intact. Psych: Normal affect.  Assessment & Plan    Palpitations - Politely declines medication. Update BMP, mag, TSH. Discussed triggers including recent stress. No caffeine, etoh. ***  Lightheadedness - Consider etiology orthostatic hypotension. Given sensation of "pulsing" in her neck, carotid duplex ordered. ***   Hyperlipidemia / Family history of heart disease / Chest pain in adult - Chest pain overall atypical as occurs at rest or with activity. Very strong family hx of CAD. 03/18/22 LDL 125. Coronary calcium score ordered. She prefers to avoid medications so politely declined cardiac CTA due to need for contrast. Offered PRN nitroglycerin which she wishes to think about.  ***    Disposition: Follow up in 2 month(s) ***with Skeet Latch, MD or APP.  Signed, Loel Dubonnet, NP 07/24/2022, 1:59 PM [place]

## 2022-07-28 ENCOUNTER — Ambulatory Visit (HOSPITAL_BASED_OUTPATIENT_CLINIC_OR_DEPARTMENT_OTHER)
Admission: RE | Admit: 2022-07-28 | Discharge: 2022-07-28 | Disposition: A | Discharge status: Home / Self Care | Payer: Medicare HMO | Source: Ambulatory Visit | Attending: Family | Admitting: Family

## 2022-07-28 DIAGNOSIS — R079 Chest pain, unspecified: Secondary | ICD-10-CM | POA: Insufficient documentation

## 2022-07-28 DIAGNOSIS — Z8249 Family history of ischemic heart disease and other diseases of the circulatory system: Secondary | ICD-10-CM | POA: Insufficient documentation

## 2022-07-29 ENCOUNTER — Telehealth (HOSPITAL_BASED_OUTPATIENT_CLINIC_OR_DEPARTMENT_OTHER): Payer: Self-pay | Admitting: Family

## 2022-07-29 ENCOUNTER — Other Ambulatory Visit (INDEPENDENT_AMBULATORY_CARE_PROVIDER_SITE_OTHER): Payer: Medicare HMO

## 2022-07-29 ENCOUNTER — Ambulatory Visit (INDEPENDENT_AMBULATORY_CARE_PROVIDER_SITE_OTHER): Payer: Medicare HMO | Admitting: Family

## 2022-07-29 ENCOUNTER — Encounter (HOSPITAL_BASED_OUTPATIENT_CLINIC_OR_DEPARTMENT_OTHER): Payer: Self-pay | Admitting: Family

## 2022-07-29 VITALS — BP 144/86 | HR 96 | Ht 63.0 in | Wt 167.0 lb

## 2022-07-29 DIAGNOSIS — R072 Precordial pain: Secondary | ICD-10-CM

## 2022-07-29 DIAGNOSIS — E785 Hyperlipidemia, unspecified: Secondary | ICD-10-CM

## 2022-07-29 DIAGNOSIS — J9859 Other diseases of mediastinum, not elsewhere classified: Secondary | ICD-10-CM | POA: Diagnosis not present

## 2022-07-29 DIAGNOSIS — R002 Palpitations: Secondary | ICD-10-CM

## 2022-07-29 DIAGNOSIS — I25118 Atherosclerotic heart disease of native coronary artery with other forms of angina pectoris: Secondary | ICD-10-CM | POA: Diagnosis not present

## 2022-07-29 MED ORDER — METOPROLOL SUCCINATE ER 25 MG PO TB24: 25.0000 mg | ORAL_TABLET | Freq: Every day | ORAL | 2 refills | Status: DC

## 2022-07-29 NOTE — Telephone Encounter (Signed)
New Message:       Erline Levine from [facility] is calling with a Stat Report

## 2022-07-29 NOTE — Telephone Encounter (Signed)
[facility] with a stat report on patient imaging from yesterday, they created an addendum to the report.   Addendum copied below and routed to Laurann Montana, NP for advisement.   "1. 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 Lesion is incompletely imaged. Anterior mediastinal teratoma is included in the differential though is felt less likely given well-defined margins and lack of calcification within visualized portions of the lesion. MRI of the chest is suggested for complete characterization.     Electronically Signed   By: Zetta Bills M.D.   On: 07/29/2022 10:25"

## 2022-07-29 NOTE — Patient Instructions (Signed)
Medication Instructions:  Your physician has recommended you make the following change in your medication:   START Metoprolol Succinate one '25mg'$  tablet daily  *If you need a refill on your cardiac medications before your next appointment, please call your pharmacy*   Lab Work: None ordered today.   Testing/Procedures: Your physician has recommended that you wear a Zio monitor.   This monitor is a medical device that records the heart's electrical activity. Doctors most often use these monitors to diagnose arrhythmias. Arrhythmias are problems with the speed or rhythm of the heartbeat. The monitor is a small device applied to your chest. You can wear one while you do your normal daily activities. While wearing this monitor if you have any symptoms to push the button and record what you felt. Once you have worn this monitor for the period of time provider prescribed (Usually 14 days), you will return the monitor device in the postage paid box. Once it is returned they will download the data collected and provide [place] with a report which the provider will then review and we will call you with those results. Important tips:  Avoid showering during the first 24 hours of wearing the monitor. Avoid excessive sweating to help maximize wear time. Do not submerge the device, no hot tubs, and no swimming pools. Keep any lotions or oils away from the patch. After 24 hours you may shower with the patch on. Take brief showers with your back facing the shower head.  Do not remove patch once it has been placed because that will interrupt data and decrease adhesive wear time. Push the button when you have any symptoms and write down what you were feeling. Once you have completed wearing your monitor, remove and place into box which has postage paid and place in your outgoing mailbox.  If for some reason you have misplaced your box then call our office and we can provide another box and/or mail it off for  you.   Your physician has requested that you have a lexiscan myoview.  Please follow instruction sheet, as given.    Follow-Up: At [facility], you and your health needs are our priority.  As part of our continuing mission to provide you with exceptional heart care, we have created designated Provider Care Teams.  These Care Teams include your primary Cardiologist (physician) and Advanced Practice Providers (APPs -  Physician Assistants and Nurse Practitioners) who all work together to provide you with the care you need, when you need it.  We recommend signing up for the patient portal called "MyChart".  Sign up information is provided on this After Visit Summary.  MyChart is used to connect with patients for Virtual Visits (Telemedicine).  Patients are able to view lab/test results, encounter notes, upcoming appointments, etc.  Non-urgent messages can be sent to your provider as well.   To learn more about what you can do with MyChart, go to [email or web address].    Your next appointment:   Follow up as scheduled   Other Instructions Natural remedies have not been found to be effective in lowering cholesterol and preventing progression of aortic atherosclerosis. I've linked a good article below for you if you are interested. It found that low dose statin was significant in reducing cholesterol compared to a number of supplements.   [email or web address]

## 2022-07-29 NOTE — Telephone Encounter (Signed)
Spoke with patient regarding the 08/07/22 9":30 am appt for the MR chest at [facility]---arrival time is 9:15 am for check in-----patient voiced her understanding

## 2022-07-29 NOTE — Telephone Encounter (Signed)
Radiology read of CT shows cystic mass in the anterior mediastinum. 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 These are cysts that can develop in the chest wall. Unlikely to cause chest discomfort. However, recommend further evaluation with MRI to further clarify the mass. MRI ordered. Please contact patient to make her aware and facilitate scheduling. TY!  Loel Dubonnet, NP

## 2022-07-29 NOTE — Addendum Note (Signed)
Addended by: Loel Dubonnet on: 07/29/2022 01:26 PM   Modules accepted: Orders

## 2022-07-29 NOTE — Telephone Encounter (Signed)
Called patient and provided the following information, forwarded to scheduling team!      "Radiology read of CT shows cystic mass in the anterior mediastinum. 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 These are cysts that can develop in the chest wall. Unlikely to cause chest discomfort. However, recommend further evaluation with MRI to further clarify the mass. MRI ordered. Please contact patient to make her aware and facilitate scheduling. TY!   Loel Dubonnet, NP "

## 2022-07-30 ENCOUNTER — Telehealth (HOSPITAL_COMMUNITY): Payer: Self-pay | Admitting: *Deleted

## 2022-07-30 NOTE — Telephone Encounter (Signed)
Patient given detailed instructions per Myocardial Perfusion Study Information Sheet for the test on  08/05/22 Patient notified to arrive 15 minutes early and that it is imperative to arrive on time for appointment to keep from having the test rescheduled.  If you need to cancel or reschedule your appointment, please call the office within 24 hours of your appointment. . Patient verbalized understanding. Theordore Cisnero Jacqueline   

## 2022-08-05 ENCOUNTER — Ambulatory Visit (HOSPITAL_COMMUNITY): Payer: Medicare HMO | Attending: Family

## 2022-08-05 DIAGNOSIS — R072 Precordial pain: Secondary | ICD-10-CM | POA: Diagnosis not present

## 2022-08-05 LAB — MYOCARDIAL PERFUSION IMAGING
Estimated workload: 7
Exercise duration (min): 5 min
Exercise duration (sec): 0 s
LV dias vol: 93 mL (ref 46–106)
LV sys vol: 6 mL
MPHR: 155 {beats}/min
Nuc Stress EF: 79 %
Peak HR: 173 {beats}/min
Percent HR: 111 %
Rest HR: 104 {beats}/min
Rest Nuclear Isotope Dose: 10.5 mCi
SDS: 0
SRS: 0
SSS: 0
ST Depression (mm): 0 mm
Stress Nuclear Isotope Dose: 30.1 mCi
TID: 1.24

## 2022-08-05 MED ORDER — TECHNETIUM TC 99M TETROFOSMIN IV KIT
10.5000 | PACK | Freq: Once | INTRAVENOUS | Status: AC | PRN
  Administered 2022-08-05: 10.5 via INTRAVENOUS

## 2022-08-05 MED ORDER — TECHNETIUM TC 99M TETROFOSMIN IV KIT
30.1000 | PACK | Freq: Once | INTRAVENOUS | Status: AC | PRN
  Administered 2022-08-05: 30.1 via INTRAVENOUS

## 2022-08-07 ENCOUNTER — Ambulatory Visit (HOSPITAL_BASED_OUTPATIENT_CLINIC_OR_DEPARTMENT_OTHER)
Admission: RE | Admit: 2022-08-07 | Discharge: 2022-08-07 | Disposition: A | Discharge status: Home / Self Care | Payer: Medicare HMO | Source: Ambulatory Visit | Attending: Family | Admitting: Family

## 2022-08-07 DIAGNOSIS — J9859 Other diseases of mediastinum, not elsewhere classified: Secondary | ICD-10-CM | POA: Diagnosis not present

## 2022-08-07 DIAGNOSIS — R222 Localized swelling, mass and lump, trunk: Secondary | ICD-10-CM | POA: Insufficient documentation

## 2022-08-07 MED ORDER — GADOPICLENOL 0.5 MMOL/ML IV SOLN
7.5000 mL | Freq: Once | INTRAVENOUS | Status: AC | PRN
  Administered 2022-08-07: 7.5 mL via INTRAVENOUS
  Filled 2022-08-07: qty 7.5

## 2022-08-11 ENCOUNTER — Telehealth (HOSPITAL_BASED_OUTPATIENT_CLINIC_OR_DEPARTMENT_OTHER): Payer: Self-pay

## 2022-08-11 DIAGNOSIS — R079 Chest pain, unspecified: Secondary | ICD-10-CM

## 2022-08-11 DIAGNOSIS — J9859 Other diseases of mediastinum, not elsewhere classified: Secondary | ICD-10-CM

## 2022-08-11 NOTE — Telephone Encounter (Addendum)
Seen by patient [name] on 08/10/2022  5:41 PM; repeat testing ordered    ----- Message from Loel Dubonnet, NP sent at 08/10/2022 11:37 AM EDT ----- MRI showed pericardial cyst. These are benign and do not usually cause symptoms. We do not typically drain them as they recur. Recommend repeat MRI chest in one year for monitoring.

## 2022-08-12 DIAGNOSIS — I25118 Atherosclerotic heart disease of native coronary artery with other forms of angina pectoris: Secondary | ICD-10-CM | POA: Diagnosis not present

## 2022-08-12 DIAGNOSIS — R002 Palpitations: Secondary | ICD-10-CM | POA: Diagnosis not present

## 2022-08-31 DIAGNOSIS — E785 Hyperlipidemia, unspecified: Secondary | ICD-10-CM | POA: Insufficient documentation

## 2022-08-31 DIAGNOSIS — I2510 Atherosclerotic heart disease of native coronary artery without angina pectoris: Secondary | ICD-10-CM | POA: Insufficient documentation

## 2022-08-31 NOTE — Progress Notes (Unsigned)
Office Visit    Patient Name: Evelyn Bright Date of Encounter: 09/01/2022  PCP:  Orma Render, NP   [place]  Cardiologist:  Skeet Latch, MD  Advanced Practice Provider:  No care team member to display Electrophysiologist:  None   Chief Complaint    Evelyn Bright is a 66 y.o. female presents today for follow up after cardiac testing  Past Medical History    Past Medical History:  Diagnosis Date   Atypical chest pain 11/08/2020   Frozen shoulder left   did PT   Osteopenia    Palpitations 11/08/2020   Pure hypercholesterolemia 11/08/2020   Past Surgical History:  Procedure Laterality Date   CHOLECYSTECTOMY  07/2006   CYSTECTOMY Left    paratubal cyst removal    Allergies  Allergies  Allergen Reactions   Compazine [Prochlorperazine]    Minocin [Minocycline]    Naproxen Rash   Prednisone Nausea Only and Rash   Sulfa Antibiotics Rash    History of Present Illness    Evelyn Bright is a 66 y.o. female with a hx of CAD,  HLD, palpitations last seen 07/29/22.  Very strong family hx of CAD. Mom with open heart surgery. Dad with CHF and CVA. Oldest brother with 12 heart attacks, 2 open heart surgery. Sister with open heart surgery. Other brother diabetic, open heart surgery. Other brother MI in his 70s and passed away of subsequent MI. She is hesitant regarding medications particularly as her family members had difficulty with cholesterol medication per her report.   Established with Dr. Oval Linsey 09/2020 due to palpitations. As symptoms were intermittent less than once per month, monitor deferred. She was very hesitant regarding medications and declined. She had atypical chest discomfort though strong family history of CAD. She was offered coronary calcium score but wished to think about it.   Seen 07/01/22 with occasional palpitations and chest pressure as well as a sensation of "pressure" in her neck. Carotid duplex 07/15/22 no  significant stenosis. Coronary calcium score 07/01/22 of 19 placing her in 65th percentile.  Seen 07/29/22. Still with chest discomfort and palpitations. CT read by radiologist after patient appointment "cystic mass in anterior mediatinum largely conforming to mediastinal contours though with convexity along left heart border suspicious for cystic thymic neoplasm or thymic cyst" recommended for MRI. Gated myoview 08/05/22 was low risk study. ZIO 08/12/22 worn for 8 days with predominantly NSR and <1% burden PVC/PAC. MR Chest 08/07/22 well circumscribed mass in mediastinum intimately associated with superior aspect of left side of heart, benign imaging features, felt to reflect pericardial cyst.   She presents today for follow up independently. Reviewed CTA, echo, MRI in detail. Still has days that she has chest pain but also has days that she is not having any. Not particularly associated with exertion. Recently walked around [place] for [employer] game without difficulty. Still with sensation of "quiver" in her neck which we discussed might be related to infrequent PVC on prior monitor. She remains hesitant regarding medical therapies and has not filled her Metoprolol. She did pick up Nitroglycerin but has not taken.   EKGs/Labs/Other Studies Reviewed:   The following studies were reviewed today:  ZIO 08/12/22 Quality: Fair.  Baseline artifact. Predominant rhythm: sinus rhythm Average heart rate: 89 bpm Max heart rate: 164 bpm Min heart rate: 51 bpm Pauses >2.5 seconds: none   Rare PACs and PVCs.  No significant arrhythmias.   MR Chest   FINDINGS: Again  noted is a well-circumscribed mediastinal mass which appears intimately associated with the pericardium (axial image 27 of series 18 and coronal image 19 of series 8) overlying the superior aspect of the left side of the heart which is irregular in shape but estimated to measure approximately 8.5 x 2.6 x 7.9 cm. This mass is near  uniform in signal intensity, T1 hypointense, T2 hyperintense, without internal diffusion restriction, and without significant internal or mural enhancement on post gadolinium imaging.   IMPRESSION: 1. Well-circumscribed mass in the mediastinum intimately associated with the superior aspect of the left side of the heart, with benign imaging features, most compatible with a pericardial cyst.    Exercise myoview 07/2022   The study is normal. The study is low risk.   No ST deviation was noted.   LV perfusion is normal. There is no evidence of ischemia. There is no evidence of infarction.   Left ventricular function is normal. Nuclear stress EF: 79 %. The left ventricular ejection fraction is hyperdynamic (>65%). End diastolic cavity size is normal. End systolic cavity size is normal.   Prior study not available for comparison.   Fair exercise capacity, achieved 7.0 METS   Peak heart rate 173 bpm, 111% max age-predicted heart rate   Normal blood pressure response to exercise    EKG:  EKG ordered today. EKG performed today ST 116 bpm with no acute ST/T wave changes.   Recent Labs: 04/07/2022: ALT 15; Hemoglobin 15.0; Platelets 220 07/01/2022: BUN 18; Creatinine, Ser 0.79; Magnesium 2.6; Potassium 5.2; Sodium 140; TSH 1.140  Recent Lipid Panel    Component Value Date/Time   CHOL 210 (H) 04/07/2022 1540   TRIG 96 04/07/2022 1540   HDL 68 04/07/2022 1540   CHOLHDL 3.1 04/07/2022 1540   CHOLHDL 2.9 12/25/2016 1413   VLDL 21 12/25/2016 1413   LDLCALC 125 (H) 04/07/2022 1540   Home Medications   Current Meds  Medication Sig   aspirin EC 81 MG tablet Take 1 tablet (81 mg total) by mouth daily. Swallow whole.   VITAMIN D PO Take by mouth.     Review of Systems      All other systems reviewed and are otherwise negative except as noted above.  Physical Exam    VS:  BP 118/82 (BP Location: Right Arm, Patient Position: Sitting, Cuff Size: Normal)   Pulse (!) 116   Ht '5\' 3"'$  (1.6 m)    Wt 162 lb 6.4 oz (73.7 kg)   LMP 11/16/2012   BMI 28.77 kg/m  , BMI Body mass index is 28.77 kg/m.  Wt Readings from Last 3 Encounters:  09/01/22 162 lb 6.4 oz (73.7 kg)  08/05/22 167 lb (75.8 kg)  07/29/22 167 lb (75.8 kg)    GEN: Well nourished, overweight, well developed, in no acute distress. HEENT: normal. Neck: Supple, no JVD, carotid bruits, or masses. Cardiac: RRR, no murmurs, rubs, or gallops. No clubbing, cyanosis, edema.  Radials/PT 2+ and equal bilaterally.  Respiratory:  Respirations regular and unlabored, clear to auscultation bilaterally. GI: Soft, nontender, nondistended. MS: No deformity or atrophy. Skin: Warm and dry, no rash. Neuro:  Strength and sensation are intact. Psych: Normal affect.  Assessment & Plan    Pericardial cyst - MR chest 08/07/22 with well circumscribed mass in mediastinum intimately associated with superior aspect of left side heart with benign imaging features felt to reflect pericardial cyst. Recommended for repeat MR in one year. Reports intermittent chest discomfort midsternal as well as under left  breast. Consider etiology muscle spasm. Low suspicion pericardial cyst contributory. Will refer to [facility] per patient request.   Palpitations - Labs 07/01/22 with normal potassium, TSH. Magnesium mildly elevated.  Likely etiology PVC/PAC.  7-day ZIO monitor 08/2022 with NSR and <1% burden PVC/PAC. Recommend continued avoidance of alcohol, caffeine.  Encouraged to stay well-hydrated, manage stress well. She has been prescribed Toprol '25mg'$  QD but is not taking - she was provided information on metoprolol to review at home.   Lightheadedness - Consider etiology orthostatic hypotension. 06/2022 normal carotid duplex. 08/2022 ZIO no significant arrhythmias. No murmur appreciated on exam.No indication for further workup at this time.   Hyperlipidemia, LDL goal <70 - 03/2022 total cholesterol 210, triglycerides 96, HDL 68, LDL 125. 03/18/22 LDL 125. Declines  addition of statin today. Concerned due to sensitivity to medications. Discussed trial of Rosuvastatin '5mg'$  three times per week, she will think about it. Referral placed to [facility] Dr. Debara Pickett.   Family history of heart disease /CAD - Very strong family hx of CAD. 03/18/22 LDL 125. 07/2022 Coronary calcium score  19 placing her in 65th percentile. Exercise myoview 08/2022 low risk study. For optimization of GDMT, start Aspirin EC '81mg'$  daily. She will consider addition of Metoprolol - has not yet picked up at the pharmacy. Heart healthy diet and regular cardiovascular exercise encouraged.      Disposition: Follow up in 6 months with Skeet Latch, MD or APP.  Signed, Loel Dubonnet, NP 09/01/2022, 2:30 PM [place]

## 2022-09-01 ENCOUNTER — Ambulatory Visit (HOSPITAL_BASED_OUTPATIENT_CLINIC_OR_DEPARTMENT_OTHER): Payer: Medicare HMO | Admitting: Family

## 2022-09-01 ENCOUNTER — Encounter (HOSPITAL_BASED_OUTPATIENT_CLINIC_OR_DEPARTMENT_OTHER): Payer: Self-pay | Admitting: Family

## 2022-09-01 VITALS — BP 118/82 | HR 116 | Ht 63.0 in | Wt 162.4 lb

## 2022-09-01 DIAGNOSIS — R002 Palpitations: Secondary | ICD-10-CM

## 2022-09-01 DIAGNOSIS — E785 Hyperlipidemia, unspecified: Secondary | ICD-10-CM

## 2022-09-01 DIAGNOSIS — I25118 Atherosclerotic heart disease of native coronary artery with other forms of angina pectoris: Secondary | ICD-10-CM

## 2022-09-01 DIAGNOSIS — Q248 Other specified congenital malformations of heart: Secondary | ICD-10-CM

## 2022-09-01 DIAGNOSIS — E782 Mixed hyperlipidemia: Secondary | ICD-10-CM

## 2022-09-01 MED ORDER — ASPIRIN 81 MG PO TBEC: 81.0000 mg | DELAYED_RELEASE_TABLET | Freq: Every day | ORAL | 3 refills | Status: DC

## 2022-09-01 NOTE — Patient Instructions (Addendum)
Medication Instructions:   Your physician has recommended you make the following change in your medication:    START Aspirin EC '81mg'$  daily  *If you need a refill on your cardiac medications before your next appointment, please call your pharmacy*   Lab Work/Testing/Procedures: None ordered today.   Your CT scan showed some plaque in your LAD (one of the arteries of your heart).  Your stress test was low risk and showed no ischemia (no reduced blood flow). Your ZIO showed normal sinus rhythm with an occasional early beat which is not dangerous occurring less than 1% of the time.   Follow-Up: At [facility], you and your health needs are our priority.  As part of our continuing mission to provide you with exceptional heart care, we have created designated Provider Care Teams.  These Care Teams include your primary Cardiologist (physician) and Advanced Practice Providers (APPs -  Physician Assistants and Nurse Practitioners) who all work together to provide you with the care you need, when you need it.  We recommend signing up for the patient portal called "MyChart".  Sign up information is provided on this After Visit Summary.  MyChart is used to connect with patients for Virtual Visits (Telemedicine).  Patients are able to view lab/test results, encounter notes, upcoming appointments, etc.  Non-urgent messages can be sent to your provider as well.   To learn more about what you can do with MyChart, go to [email or web address].    Your next appointment:   6 month(s)  The format for your next appointment:   In Person  Provider:   Skeet Latch, MD    Other Instructions  Heart Healthy Diet Recommendations: A low-salt diet is recommended. Meats should be grilled, baked, or boiled. Avoid fried foods. Focus on lean protein sources like fish or chicken with vegetables and fruits. The American Heart Association is a Microbiologist!  American Heart Association Diet and  Lifeystyle Recommendations   Exercise recommendations: The American Heart Association recommends 150 minutes of moderate intensity exercise weekly. Try 30 minutes of moderate intensity exercise 4-5 times per week. This could include walking, jogging, or swimming.  For coronary artery disease often called "heart disease" we aim for optimal guideline directed medical therapy. We use the "A, B, C"s to help keep [place] on track!  A = Aspirin '81mg'$  daily B = Beta blocker which helps to relax the heart. This is your Metoprolol. C = Cholesterol control. Most often recommend statins as they also help to stabilize the plaque that is already there based on your previous CT scan. We sometimes use a statin just three times per week.  D = Don't forget nitroglycerin! This is an emergency tablet to be used if you have chest pain.  Rosuvastatin Capsules What is this medication? ROSUVASTATIN (roe SOO va sta tin) treats high cholesterol and reduces the risk of heart attack and stroke. It works by decreasing bad cholesterol and fats (such as LDL, triglycerides) and increasing good cholesterol (HDL) in your blood. It belongs to a group of medications called statins. Changes to diet and exercise are often combined with this medication. This medicine may be used for other purposes; ask your health care provider or pharmacist if you have questions. COMMON BRAND NAME(S): Ezallor What should I tell my care team before I take this medication? They need to know if you have any of these conditions: Diabetes Frequently drink alcohol Kidney disease Liver disease Muscle cramps, pain Thyroid disease An unusual or allergic reaction  to rosuvastatin, other medications, foods, dyes, or preservatives Pregnant or trying to get pregnant Breastfeeding How should I use this medication? Take this medication by mouth. Take it as directed on the prescription label at the same time every day. Do not cut, crush, or chew this  medication. Swallow the capsules whole. You may open the capsule and put the contents in 1 teaspoon of applesauce. Swallow the medication and applesauce right away. Do not chew the medication or applesauce. You can take it with or without food. If it upsets your stomach, take it with food. Keep taking it unless your care team tells you to stop. Take antacids that have a combination of aluminum and magnesium hydroxide in them at a different time of day than this medication. Take these products 2 hours AFTER this medication. Talk to your care team about the use of this medication in children. Special care may be needed. Overdosage: If you think you have taken too much of this medicine contact a poison control center or emergency room at once. NOTE: This medicine is only for you. Do not share this medicine with others. What if I miss a dose? If you miss a dose, take it as soon as you can. If your next dose is to be taken in less than 12 hours, then do not take the missed dose. Take the next dose at your regular time. Do not take double or extra doses. What may interact with this medication? Do not take this medication with any of the following: Herbal medications like red yeast rice This medication may also interact with the following: Alcohol Antacids containing aluminum hydroxide and magnesium hydroxide Cyclosporine Other medications for high cholesterol Some medications for HIV infection Warfarin This list may not describe all possible interactions. Give your health care provider a list of all the medicines, herbs, non-prescription drugs, or dietary supplements you use. Also tell them if you smoke, drink alcohol, or use illegal drugs. Some items may interact with your medicine. What should I watch for while using this medication? Visit your care team for regular checks on your progress. Tell your care team if your symptoms do not start to get better or if they get worse. Your care team may tell  you to stop taking this medication if you develop muscle problems. If your muscle problems do not go away after stopping this medication, contact your care team. Talk to your care team if you may be pregnant. Serious birth defects can occur if you take this medication. Talk to your care team before breastfeeding. Changes to your treatment plan may be needed. This medication may increase blood sugar. Ask your care team if changes in diet or medications are needed if you have diabetes. If you are going to need surgery or other procedure, tell your care team that you are using this medication. Taking this medication is only part of a total heart healthy program. Ask your care team if there are other changes you can make to improve your overall health. What side effects may I notice from receiving this medication? Side effects that you should report to your care team as soon as possible: Allergic reactions--skin rash, itching, hives, swelling of the face, lips, tongue, or throat High blood sugar (hyperglycemia)--increased thirst or amount of urine, unusual weakness, fatigue, blurry vision Liver injury--right upper belly pain, loss of appetite, nausea, light-colored stool, dark yellow or brown urine, yellowing skin or eyes, unusual weakness, fatigue Muscle injury--unusual weakness, fatigue, muscle pain, dark yellow  or brown urine, decrease in amount of urine Redness, blistering, peeling or loosening of the skin, including inside the mouth Side effects that usually do not require medical attention (report to your care team if they continue or are bothersome): Fatigue Headache Nausea Stomach pain This list may not describe all possible side effects. Call your doctor for medical advice about side effects. You may report side effects to FDA at 1-800-FDA-1088. Where should I keep my medication? Keep out of the reach of children and pets. Store at room temperature between 15 and 30 degrees C (59 and 86  degrees F). Protect from moisture. Keep the container tightly closed. Get rid of any unused medication after the expiration date. To get rid of medications that are no longer needed or expired: Take the medication to a medication take-back program. Check with your pharmacy or law enforcement to find a location. If you cannot return the medication, check the label or package insert to see if the medication should be thrown out in the garbage or flushed down the toilet. If you are not sure, ask your care team. If it is safe to put in the trash, empty the medication out of the container. Mix the medication with cat litter, dirt, coffee grounds, or other unwanted substance. Seal the mixture in a bag or container. Put it in the trash. NOTE: This sheet is a summary. It may not cover all possible information. If you have questions about this medicine, talk to your doctor, pharmacist, or health care provider.  2023 Elsevier/Gold Standard (2020-11-29 00:00:00)      Metoprolol Extended-Release Capsules What is this medication? METOPROLOL (me TOE proe lole) treats high blood pressure and heart failure. It may also be used to prevent chest pain (angina). It works by lowering your blood pressure and heart rate, making it easier for your heart to pump blood to the rest of your body. It belongs to a group of medications called beta blockers. This medicine may be used for other purposes; ask your health care provider or pharmacist if you have questions. COMMON BRAND NAME(S): [facility] What should I tell my care team before I take this medication? They need to know if you have any of these conditions: Diabetes Heart disease Liver disease Lung or breathing disease, like asthma Pheochromocytoma Thyroid disease An unusual or allergic reaction to metoprolol, other beta blockers, drugs, foods, dyes, or preservatives Pregnant or trying to get pregnant Breast-feeding How should I use this medication? Take this  medication by mouth with water. Take it as directed on the prescription label at the same time every day. Do not cut, crush, or chew this medication. Swallow the capsules whole. You may open the capsule and put the contents in 1 teaspoon of applesauce. Swallow the medication and applesauce right away. Do not chew the medication or applesauce. Keep taking it unless your care team tells you to stop. Talk to your care team about the use of this medication in children. While it may be prescribed for children as young as 6 years for selected conditions, precautions do apply. Overdosage: If you think you have taken too much of this medicine contact a poison control center or emergency room at once. NOTE: This medicine is only for you. Do not share this medicine with others. What if I miss a dose? If you miss a dose, take it as soon as you can. If it is almost time for your next dose, take only that dose. Do not take double or  extra doses. What may interact with this medication? This medication may interact with the following: Certain medications for blood pressure, heart disease, irregular heartbeat Epinephrine Fluoxetine MAOIs like Carbex, Eldepryl, Marplan, Nardil, and Parnate Paroxetine Reserpine This list may not describe all possible interactions. Give your health care provider a list of all the medicines, herbs, non-prescription drugs, or dietary supplements you use. Also tell them if you smoke, drink alcohol, or use illegal drugs. Some items may interact with your medicine. What should I watch for while using this medication? Visit your care team for regular checks on your progress. Check your blood pressure as directed. Ask your care team what your blood pressure should be. Also, find out when you should contact them. Do not treat yourself for coughs, colds, or pain while you are using this medication without asking your care team for advice. Some medications may increase your blood pressure. You  may get drowsy or dizzy. Do not drive, use machinery, or do anything that needs mental alertness until you know how this medication affects you. Do not stand up or sit up quickly, especially if you are an older patient. This reduces the risk of dizzy or fainting spells. Alcohol may interfere with the effect of this medication. Avoid alcoholic drinks. This medication may increase blood sugar. Ask your care team if changes in diet or medications are needed if you have diabetes. What side effects may I notice from receiving this medication? Side effects that you should report to your care team as soon as possible: Allergic reactions--skin rash, itching, hives, swelling of the face, lips, tongue, or throat Heart failure--shortness of breath, swelling of the ankles, feet, or hands, sudden weight gain, unusual weakness or fatigue Low blood pressure--dizziness, feeling faint or lightheaded, blurry vision Raynaud's--cool, numb, or painful fingers or toes that may change color from pale, to blue, to red Slow heartbeat--dizziness, feeling faint or lightheaded, confusion, trouble breathing, unusual weakness or fatigue Worsening mood, feelings of depression Side effects that usually do not require medical attention (report to your care team if they continue or are bothersome): Change in sex drive or performance Diarrhea Dizziness Fatigue Headache This list may not describe all possible side effects. Call your doctor for medical advice about side effects. You may report side effects to FDA at 1-800-FDA-1088. Where should I keep my medication? Keep out of the reach of children and pets. Store at room temperature between 20 and 25 degrees C (68 and 77 degrees F). Throw away any unused medication after the expiration date. NOTE: This sheet is a summary. It may not cover all possible information. If you have questions about this medicine, talk to your doctor, pharmacist, or health care provider.  2023  Elsevier/Gold Standard (2020-12-27 00:00:00)

## 2022-09-28 ENCOUNTER — Encounter (HOSPITAL_BASED_OUTPATIENT_CLINIC_OR_DEPARTMENT_OTHER): Payer: Self-pay | Admitting: Obstetrics & Gynecology

## 2022-09-28 ENCOUNTER — Other Ambulatory Visit (HOSPITAL_COMMUNITY)
Admission: RE | Admit: 2022-09-28 | Discharge: 2022-09-28 | Disposition: A | Discharge status: Home / Self Care | Payer: Medicare HMO | Source: Ambulatory Visit | Attending: Obstetrics & Gynecology | Admitting: Obstetrics & Gynecology

## 2022-09-28 ENCOUNTER — Ambulatory Visit (INDEPENDENT_AMBULATORY_CARE_PROVIDER_SITE_OTHER): Payer: Medicare HMO | Admitting: Obstetrics & Gynecology

## 2022-09-28 VITALS — BP 138/94 | HR 84 | Ht 62.0 in | Wt 160.8 lb

## 2022-09-28 DIAGNOSIS — Z1211 Encounter for screening for malignant neoplasm of colon: Secondary | ICD-10-CM | POA: Diagnosis not present

## 2022-09-28 DIAGNOSIS — R69 Illness, unspecified: Secondary | ICD-10-CM | POA: Diagnosis not present

## 2022-09-28 DIAGNOSIS — R3129 Other microscopic hematuria: Secondary | ICD-10-CM | POA: Diagnosis not present

## 2022-09-28 DIAGNOSIS — Z124 Encounter for screening for malignant neoplasm of cervix: Secondary | ICD-10-CM

## 2022-09-28 DIAGNOSIS — R7309 Other abnormal glucose: Secondary | ICD-10-CM | POA: Diagnosis not present

## 2022-09-28 DIAGNOSIS — Z01419 Encounter for gynecological examination (general) (routine) without abnormal findings: Secondary | ICD-10-CM

## 2022-09-28 DIAGNOSIS — E785 Hyperlipidemia, unspecified: Secondary | ICD-10-CM

## 2022-09-28 DIAGNOSIS — Z1151 Encounter for screening for human papillomavirus (HPV): Secondary | ICD-10-CM | POA: Diagnosis not present

## 2022-09-28 LAB — POCT URINALYSIS DIPSTICK
Bilirubin, UA: NEGATIVE
Glucose, UA: NEGATIVE
Ketones, UA: NEGATIVE
Nitrite, UA: NEGATIVE
Protein, UA: NEGATIVE
Spec Grav, UA: <=1.005 — AB (ref 1.010–1.025)
Urobilinogen, UA: 0.2 E.U./dL
pH, UA: 5.5 (ref 5.0–8.0)

## 2022-09-28 NOTE — Progress Notes (Signed)
66 y.o. G2P2 Widowed White or Caucasian female here for breast and pelvic exam.  She has several concerns today.  Her PCP is leaving the practice where she is and she is not sure yet what she is doing for PCP care.  Wants to discuss some concerns with me today.  Has been having sensation of fluttering in her neck.  Has seen Laurann Montana with cardiology.  Cholesterol is elevated.  Coronary calcium score done 07/28/2022.   Was 70, 65th percentile.  Pt didn't understand result.  Statin recommended.  Feels like she agreed to take this but no prescription was every called in for pt.  Did wear a monitor that showed PVCs.  Wants my thoughts.  CT showed a cyst near her heart.  Had MRI showing 8cm cyst.  Seeing CT surgeon tomorrow for consult.    Has been seeing blood in her urine.  This is intermittent.  She did mention this to [facility] in cardiology who advised her to see her PCP.   Is pretty sure this isn't vaginal bleeding.  Has seen it more when walking more.  Is painless.  Saw it yesterday.  None today.    Daughter with OCD has gotten worse. Would like suggestions for possible providers for pt.   Would like some follow up blood work.  Patient's last menstrual period was 11/16/2012.          Sexually active: No.  H/O STD:  no  Health Maintenance: PCP:  Jacolyn Reedy.  Last lab work was  Vaccines are up to date:  no and she declines any today Colonoscopy:  cologuard 08/2022 MMG:  04/10/2022 Negative BMD:  03/24/2021 Osteopenia Last pap smear:  09/13/2020 Negative.   H/o abnormal pap smear:  No    reports that she has quit smoking. She has never used smokeless tobacco. She reports that she does not drink alcohol and does not use drugs.  Past Medical History:  Diagnosis Date   Atypical chest pain 11/08/2020   Frozen shoulder left   did PT   Osteopenia    Palpitations 11/08/2020   Pure hypercholesterolemia 11/08/2020    Past Surgical History:  Procedure Laterality Date   CHOLECYSTECTOMY   07/2006   CYSTECTOMY Left    paratubal cyst removal    Current Outpatient Medications  Medication Sig Dispense Refill   VITAMIN D PO Take by mouth.     No current facility-administered medications for this visit.    Family History  Problem Relation Age of Onset   Diabetes Sister    Heart disease Sister    Diabetes Brother    Pulmonary Hypertension Brother    Heart attack Brother 65   Peripheral Artery Disease Brother    Heart attack Brother        12 heart attacks and open heart surgery   Diabetes Brother    Heart attack Mother        heart surgery/staph infection   Peripheral Artery Disease Mother    Valvular heart disease Mother    Heart attack Sister        open heart surgery   Congestive Heart Failure Father    Stroke Father    Hypertension Father    Breast cancer Neg Hx     Review of Systems  Constitutional: Negative.   Genitourinary: Negative.     Exam:   BP (!) 138/94 (BP Location: Left Arm, Patient Position: Sitting, Cuff Size: Large)   Pulse 84   Ht '5\' 2"'$  (  1.575 m)   Wt 160 lb 12.8 oz (72.9 kg)   LMP 11/16/2012   BMI 29.41 kg/m   Height: '5\' 2"'$  (157.5 cm)  General appearance: alert, cooperative and appears stated age Breasts: normal appearance, no masses or tenderness Abdomen: soft, non-tender; bowel sounds normal; no masses,  no organomegaly Lymph nodes: Cervical, supraclavicular, and axillary nodes normal.  No abnormal inguinal nodes palpated Neurologic: Grossly normal  Pelvic: External genitalia:  no lesions              Urethra:  normal appearing urethra with no masses, tenderness or lesions              Bartholins and Skenes: normal                 Vagina: normal appearing vagina with atrophic changes and no discharge, no lesions              Cervix: no lesions              Pap taken: Yes.   Bimanual Exam:  Uterus:  normal size, contour, position, consistency, mobility, non-tender              Adnexa: normal adnexa and no mass, fullness,  tenderness               Rectovaginal: Confirms               Anus:  normal sphincter tone, no lesions  Chaperone, Octaviano Batty, CMA, was present for exam.  Assessment/Plan: 1. Encntr for gyn exam (general) (routine) w/o abn findings - Pap smear obtained today - Mammogram is due.  Pt has scheduled.  - Colonoscopy declined.  But willing to do cologuard.  Order will be placed. - Bone mineral density 03/24/2021 - vaccines reviewed/updated  2. Colon cancer screening - Cologuard  3. Cervical cancer screening - Cytology - PAP  4. Elevated lipids - Lipid panel will be obtained. If same, will reach out to cardiology for additional management questions.  5. Magnesium disorder - Magnesium  6. Elevated glucose on lab work - Hemoglobin A1c  7. Microscopic hematuria - Urine Microscopic - Urine Culture - POCT Urinalysis Dipstick - is going to need referral to urology for work up as well but will wait for urine culture results.

## 2022-09-28 NOTE — Patient Instructions (Signed)
Evelyn Bright [address], [place], [place] [address] Phone: [phone]

## 2022-09-29 ENCOUNTER — Encounter: Payer: Self-pay | Admitting: Thoracic Surgery (Cardiothoracic Vascular Surgery)

## 2022-09-29 ENCOUNTER — Institutional Professional Consult (permissible substitution): Payer: Medicare HMO | Admitting: Thoracic Surgery (Cardiothoracic Vascular Surgery)

## 2022-09-29 VITALS — BP 150/94 | HR 97 | Resp 18 | Ht 62.0 in | Wt 160.0 lb

## 2022-09-29 DIAGNOSIS — Q248 Other specified congenital malformations of heart: Secondary | ICD-10-CM

## 2022-09-29 LAB — LIPID PANEL
Chol/HDL Ratio: 3.2 ratio (ref 0.0–4.4)
Cholesterol, Total: 194 mg/dL (ref 100–199)
HDL: 60 mg/dL (ref >39)
LDL Chol Calc (NIH): 118 mg/dL — ABNORMAL HIGH (ref 0–99)
Triglycerides: 89 mg/dL (ref 0–149)
VLDL Cholesterol Cal: 16 mg/dL (ref 5–40)

## 2022-09-29 LAB — URINALYSIS, MICROSCOPIC ONLY
Casts: NONE SEEN /lpf
Epithelial Cells (non renal): NONE SEEN /hpf (ref 0–10)
RBC, Urine: NONE SEEN /hpf (ref 0–2)

## 2022-09-29 LAB — HEMOGLOBIN A1C
Est. average glucose Bld gHb Est-mCnc: 111 mg/dL
Hgb A1c MFr Bld: 5.5 % (ref 4.8–5.6)

## 2022-09-29 LAB — MAGNESIUM: Magnesium: 2.4 mg/dL — ABNORMAL HIGH (ref 1.6–2.3)

## 2022-09-29 NOTE — Progress Notes (Signed)
PCP is Early, Evelyn Pesa, NP Referring Provider is Evelyn Dubonnet, NP  Chief Complaint  Patient presents with   pericardial cyst    Surgical consult, MR Chest 08/09/22    HPI: Evelyn Bright is sent for consultation regarding a mediastinal cyst.  Evelyn "Evelyn Bright" Bright is a 66 year old woman with a history of hyperlipidemia, osteopenia, palpitations, and chest pain.  She was having issues with chest pain a couple of months ago.  She describes this as a tightness in the substernal to the left anterior chest region.  Not necessarily exertional.  Usually lasted a few minutes but sometimes longer.  Also complained of a quivering sensation in her neck.  That led to a cardiac work-up.  A CT for coronary calcium scoring showed a calcium score of 19 which was 65th percentile.  She really only had calcium in her LAD.  She had a stress test which was a low risk study.  The CT also showed a large anterior/middle mediastinal cyst.  It was incompletely imaged.  She had an MRI of the chest which showed a well-circumscribed mass with benign features consistent with a pericardial cyst.  She says that over the past month her chest pain/tightness has been less frequent.  Also less severe.  She still has it occasionally.   Past Medical History:  Diagnosis Date   Atypical chest pain 11/08/2020   Frozen shoulder left   did PT   Osteopenia    Palpitations 11/08/2020   Pure hypercholesterolemia 11/08/2020    Past Surgical History:  Procedure Laterality Date   CHOLECYSTECTOMY  07/2006   CYSTECTOMY Left    paratubal cyst removal    Family History  Problem Relation Age of Onset   Diabetes Sister    Heart disease Sister    Diabetes Brother    Pulmonary Hypertension Brother    Heart attack Brother 58   Peripheral Artery Disease Brother    Heart attack Brother        12 heart attacks and open heart surgery   Diabetes Brother    Heart attack Mother        heart surgery/staph infection   Peripheral Artery  Disease Mother    Valvular heart disease Mother    Heart attack Sister        open heart surgery   Congestive Heart Failure Father    Stroke Father    Hypertension Father    Breast cancer Neg Hx     Social History Social History   Tobacco Use   Smoking status: Former   Smokeless tobacco: Never   Tobacco comments:    age [age]  Vaping Use   Vaping Use: Never used  Substance Use Topics   Alcohol use: No   Drug use: No    Current Outpatient Medications  Medication Sig Dispense Refill   VITAMIN D PO Take by mouth.     No current facility-administered medications for this visit.    Allergies  Allergen Reactions   Compazine [Prochlorperazine]    Minocin [Minocycline]    Naproxen Rash   Prednisone Nausea Only and Rash   Sulfa Antibiotics Rash    Review of Systems  Constitutional:  Positive for fatigue. Negative for activity change.  HENT:  Positive for hearing loss.   Respiratory:  Positive for chest tightness.   Cardiovascular:  Positive for chest pain and palpitations. Negative for leg swelling.  Genitourinary:  Positive for frequency.  Musculoskeletal:  Positive for myalgias.  Neurological:  Positive for  dizziness.    BP (!) 150/94 (BP Location: Left Arm, Patient Position: Sitting)   Pulse 97   Resp 18   Ht '5\' 2"'$  (1.575 m)   Wt 160 lb (72.6 kg)   LMP 11/16/2012   SpO2 97% Comment: RA  BMI 29.26 kg/m  Physical Exam Vitals reviewed.  Constitutional:      General: She is not in acute distress.    Appearance: Normal appearance.  HENT:     Head: Normocephalic and atraumatic.  Eyes:     General: No scleral icterus.    Extraocular Movements: Extraocular movements intact.  Neck:     Vascular: No carotid bruit.  Cardiovascular:     Rate and Rhythm: Normal rate and regular rhythm.     Heart sounds: Normal heart sounds. No murmur heard. Pulmonary:     Effort: No respiratory distress.     Breath sounds: Normal breath sounds. No wheezing or rales.   Abdominal:     General: There is no distension.     Palpations: Abdomen is soft.  Musculoskeletal:     Cervical back: Neck supple.  Lymphadenopathy:     Cervical: No cervical adenopathy.  Skin:    General: Skin is warm and dry.  Neurological:     General: No focal deficit present.     Mental Status: She is alert and oriented to person, place, and time.     Cranial Nerves: No cranial nerve deficit.     Motor: No weakness.    Diagnostic Tests: MR CHEST WITH AND WITHOUT CONTRAST   TECHNIQUE: Multiplanar, multisequence MR imaging of the thorax was performed before and after the administration of intravenous contrast.   CONTRAST:  7.5 mL of Vueway   COMPARISON:  None Available.   FINDINGS: Again noted is a well-circumscribed mediastinal mass which appears intimately associated with the pericardium (axial image 27 of series 18 and coronal image 19 of series 8) overlying the superior aspect of the left side of the heart which is irregular in shape but estimated to measure approximately 8.5 x 2.6 x 7.9 cm. This mass is near uniform in signal intensity, T1 hypointense, T2 hyperintense, without internal diffusion restriction, and without significant internal or mural enhancement on post gadolinium imaging.   IMPRESSION: 1. Well-circumscribed mass in the mediastinum intimately associated with the superior aspect of the left side of the heart, with benign imaging features, most compatible with a pericardial cyst.     Electronically Signed   By: Vinnie Langton M.D.   On: 08/09/2022 05:50 I personally reviewed the CT and MR images.  There is an 8 x 2.6 x 8 cm cystic mass with thin walls and uniform fluid in the left anterior/middle mediastinum.  Impression: Evelyn Bright is a 66 year old woman with a history of hyperlipidemia, osteopenia, palpitations, and chest pain.  She presented with atypical chest pain.  Work-up included a CT for coronary calcium screening.  It  showed an anterior/middle mediastinal mass on the left side.  Mediastinal cyst-benign imaging characteristics by MR.  Most likely either a pericardial cyst or thymic cyst.  I discussed potential options with Evelyn Bright.  Options include radiographic observation, percutaneous aspiration +/- ablation, or a robotic resection.  We discussed the relative advantages and disadvantages of each approach.  Given the completely benign radiographic characteristics, radiographic observation is a reasonable option.  It is unclear if this cyst is causing her pain.  Given its size it certainly is possible, but there is no way to  know for certain.  Since her symptoms are improving, I would give that time.  If her symptoms worsen and there is no other explanation then she may wish to have the cyst aspirated or removed.  CAD-CT for coronary calcium scoring showed a score of 19 in the LAD.  No other significant calcifications.  Stress test showed no evidence of ischemia.  Plan: Return in 6 months with MR chest to follow-up mediastinal cyst.  I spent 30 minutes in review of records, images, and in consultation with Mrs. Alwine today. Melrose Nakayama, MD [facility] [phone]

## 2022-09-30 LAB — CYTOLOGY - PAP
Comment: NEGATIVE
Diagnosis: NEGATIVE
High risk HPV: NEGATIVE

## 2022-09-30 LAB — URINE CULTURE

## 2022-10-05 ENCOUNTER — Other Ambulatory Visit (HOSPITAL_BASED_OUTPATIENT_CLINIC_OR_DEPARTMENT_OTHER): Payer: Self-pay | Admitting: Obstetrics & Gynecology

## 2022-10-05 DIAGNOSIS — R310 Gross hematuria: Secondary | ICD-10-CM

## 2022-10-23 ENCOUNTER — Other Ambulatory Visit (HOSPITAL_BASED_OUTPATIENT_CLINIC_OR_DEPARTMENT_OTHER): Payer: Self-pay | Admitting: Obstetrics & Gynecology

## 2022-10-23 DIAGNOSIS — R310 Gross hematuria: Secondary | ICD-10-CM

## 2022-10-28 ENCOUNTER — Encounter: Payer: Self-pay | Admitting: Internal Medicine

## 2022-12-24 ENCOUNTER — Telehealth (HOSPITAL_BASED_OUTPATIENT_CLINIC_OR_DEPARTMENT_OTHER): Payer: Self-pay | Admitting: Obstetrics & Gynecology

## 2022-12-24 NOTE — Telephone Encounter (Signed)
Patient called stated she is returning [name] call.

## 2022-12-25 NOTE — Telephone Encounter (Signed)
Attempted to return pts call. Voicemail left

## 2023-01-04 ENCOUNTER — Encounter: Payer: Self-pay | Admitting: *Deleted

## 2023-01-05 ENCOUNTER — Telehealth (HOSPITAL_BASED_OUTPATIENT_CLINIC_OR_DEPARTMENT_OTHER): Payer: Self-pay | Admitting: *Deleted

## 2023-01-05 NOTE — Telephone Encounter (Signed)
Patient called and would like for someone to please call her.

## 2023-01-05 NOTE — Telephone Encounter (Signed)
Pt left message on nurse line. Attempted to return pts call. Left message on voicemail.

## 2023-01-06 NOTE — Telephone Encounter (Signed)
Pt called with complaints of having a sharp pain on her lower right side that comes and goes. She saw Dr. Sabra Heck back in November and was noted to have some blood in her urine. A referral was placed to [facility]. Pt states that she never heard from their office for scheduling and does not want to see a provider who does not attempt to reach out after 2 referrals have been placed. Pt states that Dr. Sabra Heck mentioned doing a test to look at her ovaries and is asking if that can be done to rule out any potential issues. Pt states that she did notice more blood on her pantyliner this past week when using the restroom. Advised that I would send request to provider and let her know recommendations.

## 2023-01-07 ENCOUNTER — Other Ambulatory Visit (HOSPITAL_BASED_OUTPATIENT_CLINIC_OR_DEPARTMENT_OTHER): Payer: Self-pay | Admitting: Obstetrics & Gynecology

## 2023-01-07 DIAGNOSIS — R310 Gross hematuria: Secondary | ICD-10-CM

## 2023-01-08 NOTE — Telephone Encounter (Signed)
LMOVM that referral had been made to [facility] in [place]. Advised pt to call for appt if she had not heard from them by the end of next week. Advised pt to call office to schedule ultrasound appt as well.

## 2023-01-25 ENCOUNTER — Other Ambulatory Visit: Payer: Self-pay

## 2023-01-25 ENCOUNTER — Emergency Department (HOSPITAL_BASED_OUTPATIENT_CLINIC_OR_DEPARTMENT_OTHER)
Admission: EM | Admit: 2023-01-25 | Discharge: 2023-01-25 | Disposition: A | Discharge status: Home / Self Care | Payer: Medicare HMO | Attending: Emergency Medicine | Admitting: Emergency Medicine

## 2023-01-25 ENCOUNTER — Emergency Department (HOSPITAL_BASED_OUTPATIENT_CLINIC_OR_DEPARTMENT_OTHER): Payer: Medicare HMO

## 2023-01-25 ENCOUNTER — Encounter (HOSPITAL_BASED_OUTPATIENT_CLINIC_OR_DEPARTMENT_OTHER): Payer: Self-pay | Admitting: Emergency Medicine

## 2023-01-25 DIAGNOSIS — D72829 Elevated white blood cell count, unspecified: Secondary | ICD-10-CM | POA: Diagnosis not present

## 2023-01-25 DIAGNOSIS — N132 Hydronephrosis with renal and ureteral calculous obstruction: Secondary | ICD-10-CM | POA: Insufficient documentation

## 2023-01-25 DIAGNOSIS — N200 Calculus of kidney: Secondary | ICD-10-CM

## 2023-01-25 DIAGNOSIS — N3289 Other specified disorders of bladder: Secondary | ICD-10-CM | POA: Diagnosis not present

## 2023-01-25 DIAGNOSIS — R1033 Periumbilical pain: Secondary | ICD-10-CM | POA: Diagnosis not present

## 2023-01-25 DIAGNOSIS — R109 Unspecified abdominal pain: Secondary | ICD-10-CM

## 2023-01-25 LAB — URINALYSIS, ROUTINE W REFLEX MICROSCOPIC
Bilirubin Urine: NEGATIVE
Glucose, UA: NEGATIVE mg/dL
Ketones, ur: NEGATIVE mg/dL
Leukocytes,Ua: NEGATIVE
Nitrite: NEGATIVE
Protein, ur: 30 mg/dL — AB
Specific Gravity, Urine: 1.022 (ref 1.005–1.030)
pH: 6.5 (ref 5.0–8.0)

## 2023-01-25 LAB — CBC
HCT: 46.4 % — ABNORMAL HIGH (ref 36.0–46.0)
Hemoglobin: 15.6 g/dL — ABNORMAL HIGH (ref 12.0–15.0)
MCH: 29.8 pg (ref 26.0–34.0)
MCHC: 33.6 g/dL (ref 30.0–36.0)
MCV: 88.5 fL (ref 80.0–100.0)
Platelets: 247 10*3/uL (ref 150–400)
RBC: 5.24 MIL/uL — ABNORMAL HIGH (ref 3.87–5.11)
RDW: 12.5 % (ref 11.5–15.5)
WBC: 14.2 10*3/uL — ABNORMAL HIGH (ref 4.0–10.5)
nRBC: 0 % (ref 0.0–0.2)

## 2023-01-25 LAB — COMPREHENSIVE METABOLIC PANEL
ALT: 16 U/L (ref 0–44)
AST: 18 U/L (ref 15–41)
Albumin: 4.4 g/dL (ref 3.5–5.0)
Alkaline Phosphatase: 75 U/L (ref 38–126)
Anion gap: 8 (ref 5–15)
BUN: 24 mg/dL — ABNORMAL HIGH (ref 8–23)
CO2: 28 mmol/L (ref 22–32)
Calcium: 10.2 mg/dL (ref 8.9–10.3)
Chloride: 101 mmol/L (ref 98–111)
Creatinine, Ser: 0.84 mg/dL (ref 0.44–1.00)
GFR, Estimated: >60 mL/min (ref >60)
Glucose, Bld: 119 mg/dL — ABNORMAL HIGH (ref 70–99)
Potassium: 4.3 mmol/L (ref 3.5–5.1)
Sodium: 137 mmol/L (ref 135–145)
Total Bilirubin: 0.5 mg/dL (ref 0.3–1.2)
Total Protein: 8.2 g/dL — ABNORMAL HIGH (ref 6.5–8.1)

## 2023-01-25 LAB — LIPASE, BLOOD: Lipase: 68 U/L — ABNORMAL HIGH (ref 11–51)

## 2023-01-25 MED ORDER — TAMSULOSIN HCL 0.4 MG PO CAPS: 0.4000 mg | ORAL_CAPSULE | Freq: Every day | ORAL | 0 refills | Status: DC

## 2023-01-25 MED ORDER — ONDANSETRON HCL 4 MG PO TABS: 4.0000 mg | ORAL_TABLET | Freq: Four times a day (QID) | ORAL | 0 refills | Status: DC

## 2023-01-25 MED ORDER — IOHEXOL 300 MG/ML  SOLN
100.0000 mL | Freq: Once | INTRAMUSCULAR | Status: AC | PRN
  Administered 2023-01-25: 85 mL via INTRAVENOUS

## 2023-01-25 NOTE — ED Triage Notes (Signed)
Abdominal /back pain x  "a few months" Notice small amount of blood in urine Feels worse today

## 2023-01-25 NOTE — ED Notes (Signed)
Provider at bedside

## 2023-01-25 NOTE — ED Provider Notes (Signed)
Care transferred from [facility], PA-C at time of sign out. See their note for full assessment.   Briefly: Patient is 67 y.o. female with history of *** who presents to the ED with ***.    Plan: Plan per previous PA-C: CT AP and if wnl, dc home with follow up.  Labs Reviewed  LIPASE, BLOOD - Abnormal; Notable for the following components:      Result Value   Lipase 68 (*)    All other components within normal limits  COMPREHENSIVE METABOLIC PANEL - Abnormal; Notable for the following components:   Glucose, Bld 119 (*)    BUN 24 (*)    Total Protein 8.2 (*)    All other components within normal limits  CBC - Abnormal; Notable for the following components:   WBC 14.2 (*)    RBC 5.24 (*)    Hemoglobin 15.6 (*)    HCT 46.4 (*)    All other components within normal limits  URINALYSIS, ROUTINE W REFLEX MICROSCOPIC - Abnormal; Notable for the following components:   Hgb urine dipstick SMALL (*)    Protein, ur 30 (*)    Bacteria, UA RARE (*)    All other components within normal limits    CT AP w: IMPRESSION: 1. Moderate right hydronephrosis and right perinephric/periureteral fat stranding with obstructing stone of the proximal right ureter measuring 6 mm. 2. Indeterminate exophytic lesion of the upper pole of the left kidney measuring 9 mm, differential considerations include proteinaceous/hemorrhagic cyst or small RCC. Recommend contrast-enhanced MRI of the abdomen for further evaluation, this can be performed non emergently. 3. Mild bladder wall thickening, findings can be seen in the setting of cystitis. Correlate with urinalysis. 4. Aortic Atherosclerosis (ICD10-I70.0).   Appt with urology on 3/15      {If any imaging findings} {any consults}. {Vital signs}. {Low suspicion for...}. Supportive care and strict return precautions discussed with patient. Patient acknowledges and verbalizes understanding. Pt appears safe for discharge. Follow up as indicated in discharge  paperwork.    This chart was dictated using voice recognition software, Dragon. Despite the best efforts of this provider to proofread and correct errors, errors may still occur which can change documentation meaning.

## 2023-01-25 NOTE — ED Provider Notes (Signed)
[facility] Provider Note   CSN: [id number] Arrival date & time: 01/25/23  1700     History  Chief Complaint  Patient presents with   Abdominal Pain    Evelyn Bright is a 67 y.o. female.  Patient with history of pericardial cyst, history of cholecystectomy, history of ovarian cyst --presents to the emergency department for evaluation of intermittent abdominal pains.  Her symptoms started back in October.  She reports pain in the lower abdomen that initially she thought might have been an ovarian cyst.  This was monitored and after developing some hematuria late last year, she was awaiting a urology referral from her GYN.  She had trouble getting an appointment and has not yet seen them..  Her symptoms have been intermittent and have been worse over the past 2 weekends and she is having daily pain that is more severe over the past 3 days.  She has had some nausea but no vomiting.  No diarrhea or constipation.  No dysuria, hematuria, increased frequency or urgency at the current time.  She has upcoming follow-up with OB/GYN for a pelvic ultrasound as well as a appointment with urology.  Symptoms were severe enough that she wanted to come get checked today.  Symptoms have improved somewhat since ED arrival.       Home Medications Prior to Admission medications   Medication Sig Start Date End Date Taking? Authorizing Provider  VITAMIN D PO Take by mouth.    [provider]      Allergies    Compazine [prochlorperazine], Minocin [minocycline], Naproxen, Prednisone, and Sulfa antibiotics    Review of Systems   Review of Systems  Physical Exam Updated Vital Signs BP (!) 158/102 (BP Location: Right Arm)   Pulse (!) 109   Temp 98 F (36.7 C) (Oral)   Resp 18   LMP 11/16/2012   SpO2 100%  Physical Exam Vitals and nursing note reviewed.  Constitutional:      General: She is not in acute distress.    Appearance: She is  well-developed.  HENT:     Head: Normocephalic and atraumatic.     Right Ear: External ear normal.     Left Ear: External ear normal.     Nose: Nose normal.  Eyes:     Conjunctiva/sclera: Conjunctivae normal.  Cardiovascular:     Rate and Rhythm: Normal rate and regular rhythm.     Heart sounds: No murmur heard. Pulmonary:     Effort: No respiratory distress.     Breath sounds: No wheezing, rhonchi or rales.  Abdominal:     Palpations: Abdomen is soft.     Tenderness: There is abdominal tenderness in the right lower quadrant, periumbilical area and suprapubic area. There is no guarding or rebound. Negative signs include Murphy's sign and McBurney's sign.  Musculoskeletal:     Cervical back: Normal range of motion and neck supple.     Right lower leg: No edema.     Left lower leg: No edema.  Skin:    General: Skin is warm and dry.     Findings: No rash.  Neurological:     General: No focal deficit present.     Mental Status: She is alert. Mental status is at baseline.     Motor: No weakness.  Psychiatric:        Mood and Affect: Mood normal.     ED Results / Procedures / Treatments   Labs (all  labs ordered are listed, but only abnormal results are displayed) Labs Reviewed  LIPASE, BLOOD - Abnormal; Notable for the following components:      Result Value   Lipase 68 (*)    All other components within normal limits  COMPREHENSIVE METABOLIC PANEL - Abnormal; Notable for the following components:   Glucose, Bld 119 (*)    BUN 24 (*)    Total Protein 8.2 (*)    All other components within normal limits  CBC - Abnormal; Notable for the following components:   WBC 14.2 (*)    RBC 5.24 (*)    Hemoglobin 15.6 (*)    HCT 46.4 (*)    All other components within normal limits  URINALYSIS, ROUTINE W REFLEX MICROSCOPIC - Abnormal; Notable for the following components:   Hgb urine dipstick SMALL (*)    Protein, ur 30 (*)    Bacteria, UA RARE (*)    All other components within  normal limits    EKG None  Radiology No results found.  Procedures Procedures    Medications Ordered in ED Medications - No data to display  ED Course/ Medical Decision Making/ A&P    Patient seen and examined. History obtained directly from patient.   Labs/EKG: Ordered CBC, CMP, lipase, UA.  Imaging: Ordered CT abdomen pelvis.  Medications/Fluids: Offered pain medication but patient declines  Most recent vital signs reviewed and are as follows: BP (!) 158/102 (BP Location: Right Arm)   Pulse (!) 109   Temp 98 F (36.7 C) (Oral)   Resp 18   LMP 11/16/2012   SpO2 100%   Initial impression: Acute on chronic lower abdominal pain  7:19 PM Signout to Blue PA-C at shift change.   Labs personally reviewed and interpreted including CBC with elevated white blood cell count at 14.2, elevated hemoglobin of 15.6; CMP normal kidney function otherwise unremarkable; lipase minimally elevated at 68; UA without compelling signs of infection.                            Medical Decision Making Amount and/or Complexity of Data Reviewed Labs: ordered. Radiology: ordered.   For this patient's complaint of abdominal pain, the following conditions were considered on the differential diagnosis: gastritis/PUD, enteritis/duodenitis, appendicitis, cholelithiasis/cholecystitis, cholangitis, pancreatitis, ruptured viscus, colitis, diverticulitis, small/large bowel obstruction, proctitis, cystitis, pyelonephritis, ureteral colic, aortic dissection, aortic aneurysm. In women, pelvic inflammatory disease, ovarian cysts, and tubo-ovarian abscess were also considered. Atypical chest etiologies were also considered including ACS, PE, and pneumonia.         Final Clinical Impression(s) / ED Diagnoses Final diagnoses:  None    Rx / DC Orders ED Discharge Orders     None         Carlisle Cater, PA-C 01/25/23 1920    Malvin Johns, MD 01/25/23 1932

## 2023-01-25 NOTE — Discharge Instructions (Addendum)
Your CT scan was notable for a stone to the right kidney.  It also showed incidental findings of cyst to your left kidney, this can be followed with an MRI of your abdomen by your primary care provider.  You will sent a prescription for zofran and flomax, take as directed. It is important that you maintain follow up appointment with your urologist as scheduled on 01/29/23.  Follow-up with your primary care provider scheduled.  Return to the emergency department if you are experiencing increasing/worsening symptoms.

## 2023-01-26 ENCOUNTER — Telehealth: Payer: Self-pay | Admitting: Family

## 2023-01-26 NOTE — Telephone Encounter (Signed)
Returned call to patient she was on the line with the kidney specialist, she states she is not taking aspirin at this. She appreciates [place] getting back with her but will ask the kidney specialist what is most effective.     "Looks like she was supposed to start aspirin '81mg'$  at visit with Urban Gibson on 09/01/22 but don't see this on her med list. BP and renal function are normal. Has slightly elevated calcium score but no overt ASCVD.    If she's taking aspirin, would recommend she use Tylenol over ibuprofen to decrease risk of GI bleeding. If she's not on aspirin, ok to use either."

## 2023-01-26 NOTE — Telephone Encounter (Signed)
Looks like she was supposed to start aspirin '81mg'$  at visit with Urban Gibson on 09/01/22 but don't see this on her med list. BP and renal function are normal. Has slightly elevated calcium score but no overt ASCVD.   If she's taking aspirin, would recommend she use Tylenol over ibuprofen to decrease risk of GI bleeding. If she's not on aspirin, ok to use either.

## 2023-01-26 NOTE — Telephone Encounter (Signed)
Pt states she has a kidney stone and she wants to know what it safe to take with her other cardiac medications, Tylenol or Advil?

## 2023-01-27 ENCOUNTER — Ambulatory Visit (HOSPITAL_BASED_OUTPATIENT_CLINIC_OR_DEPARTMENT_OTHER): Payer: Medicare HMO | Admitting: Obstetrics & Gynecology

## 2023-01-27 ENCOUNTER — Ambulatory Visit (INDEPENDENT_AMBULATORY_CARE_PROVIDER_SITE_OTHER): Payer: Medicare HMO

## 2023-01-27 ENCOUNTER — Encounter (HOSPITAL_BASED_OUTPATIENT_CLINIC_OR_DEPARTMENT_OTHER): Payer: Self-pay | Admitting: Obstetrics & Gynecology

## 2023-01-27 ENCOUNTER — Other Ambulatory Visit (HOSPITAL_BASED_OUTPATIENT_CLINIC_OR_DEPARTMENT_OTHER): Payer: Self-pay | Admitting: *Deleted

## 2023-01-27 VITALS — BP 128/90 | HR 97 | Ht 62.0 in | Wt 161.8 lb

## 2023-01-27 DIAGNOSIS — N3090 Cystitis, unspecified without hematuria: Secondary | ICD-10-CM

## 2023-01-27 DIAGNOSIS — R310 Gross hematuria: Secondary | ICD-10-CM

## 2023-01-27 DIAGNOSIS — R1032 Left lower quadrant pain: Secondary | ICD-10-CM | POA: Diagnosis not present

## 2023-01-27 DIAGNOSIS — N2889 Other specified disorders of kidney and ureter: Secondary | ICD-10-CM | POA: Diagnosis not present

## 2023-01-27 MED ORDER — NITROFURANTOIN MONOHYD MACRO 100 MG PO CAPS: 100.0000 mg | ORAL_CAPSULE | Freq: Two times a day (BID) | ORAL | 0 refills | Status: DC

## 2023-01-29 DIAGNOSIS — N202 Calculus of kidney with calculus of ureter: Secondary | ICD-10-CM | POA: Diagnosis not present

## 2023-01-29 DIAGNOSIS — N281 Cyst of kidney, acquired: Secondary | ICD-10-CM | POA: Diagnosis not present

## 2023-01-29 LAB — URINE CULTURE: Organism ID, Bacteria: NO GROWTH

## 2023-01-30 NOTE — Progress Notes (Signed)
GYNECOLOGY  VISIT  CC:   follow up after ultrasound  HPI: 67 y.o. G2P2 Widowed White or Caucasian female here for follow up after ultrasound.  No abnormalities noted.  Pt has been diagnosed with kidney stones via CT on 3/11.  Obstructing stone is present.  Possible cystitis noted  She does have appt with urology next week.  Possible renal cell carcinoma finding on CT reviewed.  Advised pt she is going to need and MRI and if this is concerns, partial nephrectomy will be needed.  Likelihood of lesion being contained very likely due to size but importance of follow up and completing the testing dicussed.  She voices clear understanding..  .   Past Medical History:  Diagnosis Date   Atypical chest pain 11/08/2020   Frozen shoulder left   did PT   Osteopenia    Palpitations 11/08/2020   Pure hypercholesterolemia 11/08/2020    MEDS:   Current Outpatient Medications on File Prior to Visit  Medication Sig Dispense Refill   tamsulosin (FLOMAX) 0.4 MG CAPS capsule Take 1 capsule (0.4 mg total) by mouth daily. 30 capsule 0   VITAMIN D PO Take by mouth.     ondansetron (ZOFRAN) 4 MG tablet Take 1 tablet (4 mg total) by mouth every 6 (six) hours. (Patient not taking: Reported on 01/27/2023) 12 tablet 0   No current facility-administered medications on file prior to visit.    ALLERGIES: Compazine [prochlorperazine], Minocin [minocycline], Naproxen, Prednisone, and Sulfa antibiotics  SH:  widowed, non smoker  Review of Systems  Constitutional: Negative.   Genitourinary:  Positive for hematuria.    PHYSICAL EXAMINATION:    BP (!) 128/90 (BP Location: Left Arm, Patient Position: Sitting, Cuff Size: Large)   Pulse 97   Ht 5\' 2"  (1.575 m) Comment: Reported  Wt 161 lb 12.8 oz (73.4 kg)   LMP 11/16/2012   BMI 29.59 kg/m     General appearance: alert, cooperative and appears stated age  Assessment/Plan: 1. LLQ pain - reassured pt of normal pelvic ultrasound  2. Gross hematuria - has  urology appt next week  3. Cystitis - due to CT findings, will treat in case has UTI and urine culture sent today - Urine Culture - nitrofurantoin, macrocrystal-monohydrate, (MACROBID) 100 MG capsule; Take 1 capsule (100 mg total) by mouth 2 (two) times daily.  Dispense: 10 capsule; Refill: 0  4. Renal mass - discussed findings with pt and need for MRI.  She understands importance of this follow up.

## 2023-02-12 ENCOUNTER — Other Ambulatory Visit: Payer: Self-pay | Admitting: Urology

## 2023-02-12 DIAGNOSIS — N281 Cyst of kidney, acquired: Secondary | ICD-10-CM

## 2023-02-16 ENCOUNTER — Telehealth: Payer: Self-pay | Admitting: Family

## 2023-02-16 ENCOUNTER — Other Ambulatory Visit: Payer: Self-pay | Admitting: Thoracic Surgery (Cardiothoracic Vascular Surgery)

## 2023-02-16 DIAGNOSIS — Q248 Other specified congenital malformations of heart: Secondary | ICD-10-CM

## 2023-02-16 NOTE — Telephone Encounter (Signed)
Hi ladies,   [facility] ordered MR for repeat in september, Dr. Sabra Heck it looks like you recently ordered an additional MR for concerning mass. Am I correct in assuming that this does not need to wait until her other repeat in September. Just wanted to get the full picture before calling the patient. Would appreciate both of your inputs, thanks!

## 2023-02-16 NOTE — Telephone Encounter (Signed)
Pt would like a callback in regards to MRI for her heart that she was advised to get in a yr at her last office visit and she has one that will be schedule soon for her kidneys as well. She wanted to know if she's able to have both done at [facility] location and if she could have them both done at the same time so prevent having to make 2 separate appts. Please advise

## 2023-02-16 NOTE — Telephone Encounter (Signed)
Returned call to patient, provided the following recommendations. Patient states that Dr. Roxan Hockey wanted her to have done at 6 months. Instructed patient that she would need to reach out to them to have the scan ordered for sooner. Encouraged her to follow up with her insurance to make sure they will pay for a repeat scan this soon.     "Abdominal MRI ordered by Dr. Claudia Desanctis of urology. Including her as FYI.   Her cardiac MRI is not due until 07/2023 for monitoring of pericardial cyst which is 5 months from now. Low suspicion her insurance would pay to repeat that study at this time. Would recommend proceed with just the abdominal MRI at this time.    Loel Dubonnet, NP"

## 2023-02-16 NOTE — Telephone Encounter (Signed)
Abdominal MRI ordered by Dr. Claudia Desanctis of urology. Including her as FYI.  Her cardiac MRI is not due until 07/2023 for monitoring of pericardial cyst which is 5 months from now. Low suspicion her insurance would pay to repeat that study at this time. Would recommend proceed with just the abdominal MRI at this time.   Loel Dubonnet, NP

## 2023-02-19 ENCOUNTER — Other Ambulatory Visit: Payer: Self-pay | Admitting: Urology

## 2023-02-22 ENCOUNTER — Telehealth: Payer: Self-pay | Admitting: *Deleted

## 2023-02-22 ENCOUNTER — Telehealth (HOSPITAL_BASED_OUTPATIENT_CLINIC_OR_DEPARTMENT_OTHER): Payer: Self-pay

## 2023-02-22 NOTE — Telephone Encounter (Signed)
   Pre-operative Risk Assessment    Patient Name: Evelyn Bright  DOB: [birth year] MRN: [id number]      Request for Surgical Clearance    Procedure:   Cysto, Ureteroscopy, Retrograde Pyelogram, Laser Litho, Stent  Date of Surgery:  Clearance 03/02/23                                 Surgeon:  Estrellita Ludwig, MD Surgeon's Group or Practice Name:  [facility] Phone number:  [phone] Fax number:  [phone]   Type of Clearance Requested:   - Medical    Type of Anesthesia:   Not specified   Additional requests/questions:   None  Signed, Jeanene Erb   02/22/2023, 3:44 PM

## 2023-02-22 NOTE — Telephone Encounter (Signed)
Pt has been scheduled for tele pre op appt 02/25/23 @ 3 pm. Med rec and consent are done..    Patient Consent for Virtual Visit        Evelyn Bright has provided verbal consent on 02/22/2023 for a virtual visit (video or telephone).   CONSENT FOR VIRTUAL VISIT FOR:  Evelyn Bright  By participating in this virtual visit I agree to the following:  I hereby voluntarily request, consent and authorize [facility] and its employed or contracted physicians, physician assistants, nurse practitioners or other licensed health care professionals (the Practitioner), to provide me with telemedicine health care services (the "Services") as deemed necessary by the treating Practitioner. I acknowledge and consent to receive the Services by the Practitioner via telemedicine. I understand that the telemedicine visit will involve communicating with the Practitioner through live audiovisual communication technology and the disclosure of certain medical information by electronic transmission. I acknowledge that I have been given the opportunity to request an in-person assessment or other available alternative prior to the telemedicine visit and am voluntarily participating in the telemedicine visit.  I understand that I have the right to withhold or withdraw my consent to the use of telemedicine in the course of my care at any time, without affecting my right to future care or treatment, and that the Practitioner or I may terminate the telemedicine visit at any time. I understand that I have the right to inspect all information obtained and/or recorded in the course of the telemedicine visit and may receive copies of available information for a reasonable fee.  I understand that some of the potential risks of receiving the Services via telemedicine include:  Delay or interruption in medical evaluation due to technological equipment failure or disruption; Information transmitted may not be sufficient (e.g. poor  resolution of images) to allow for appropriate medical decision making by the Practitioner; and/or  In rare instances, security protocols could fail, causing a breach of personal health information.  Furthermore, I acknowledge that it is my responsibility to provide information about my medical history, conditions and care that is complete and accurate to the best of my ability. I acknowledge that Practitioner's advice, recommendations, and/or decision may be based on factors not within their control, such as incomplete or inaccurate data provided by me or distortions of diagnostic images or specimens that may result from electronic transmissions. I understand that the practice of medicine is not an exact science and that Practitioner makes no warranties or guarantees regarding treatment outcomes. I acknowledge that a copy of this consent can be made available to me via my patient portal [facility] MyChart), or I can request a printed copy by calling the office of [facility].    I understand that my insurance will be billed for this visit.   I have read or had this consent read to me. I understand the contents of this consent, which adequately explains the benefits and risks of the Services being provided via telemedicine.  I have been provided ample opportunity to ask questions regarding this consent and the Services and have had my questions answered to my satisfaction. I give my informed consent for the services to be provided through the use of telemedicine in my medical care

## 2023-02-22 NOTE — Telephone Encounter (Signed)
Pt has been scheduled for tele pre op appt 02/25/23 @ 3 pm. Med rec and consent are done.Evelyn Bright

## 2023-02-22 NOTE — Telephone Encounter (Signed)
   Name: Evelyn Bright  DOB: 1955-12-20  MRN: [id number]  Primary Cardiologist: Chilton Si, MD   Preoperative team, please contact this patient and set up a phone call appointment for further preoperative risk assessment. Please obtain consent and complete medication review. Thank you for your help.  I confirm that guidance regarding antiplatelet and oral anticoagulation therapy has been completed and, if necessary, noted below.  None requested.   Ronney Asters, NP 02/22/2023, 3:58 PM  [facility]

## 2023-02-23 ENCOUNTER — Encounter (HOSPITAL_BASED_OUTPATIENT_CLINIC_OR_DEPARTMENT_OTHER): Payer: Self-pay | Admitting: Urology

## 2023-02-24 DIAGNOSIS — N201 Calculus of ureter: Secondary | ICD-10-CM | POA: Diagnosis not present

## 2023-02-25 ENCOUNTER — Ambulatory Visit: Payer: Medicare HMO | Attending: Cardiology | Admitting: Student

## 2023-02-25 DIAGNOSIS — Z01810 Encounter for preprocedural cardiovascular examination: Secondary | ICD-10-CM

## 2023-02-25 NOTE — Progress Notes (Signed)
Virtual Visit via Telephone Note   Because of Evelyn Bright's co-morbid illnesses, she is at least at moderate risk for complications without adequate follow up.  This format is felt to be most appropriate for this patient at this time.  The patient did not have access to video technology/had technical difficulties with video requiring transitioning to audio format only (telephone).  All issues noted in this document were discussed and addressed.  No physical exam could be performed with this format.  Please refer to the patient's chart for her consent to telehealth for [facility].  Evaluation Performed:  Preoperative cardiovascular risk assessment _____________   Date:  02/25/2023   Patient ID:  Evelyn Bright, DOB 22-Dec-1955, MRN [id number] Patient Location:  Home Provider location:   Office  Primary Care Provider:  Tollie Eth, NP Primary Cardiologist:  Chilton Si, MD  Chief Complaint / Patient Profile   67 y.o. y/o female with a h/o nonobstructive CAD, hyperlipidemia, palpitations who is pending cystoscopy, ureteroscopy, retrograde pyelogram, laser litho-, stent by Dr. Arita Miss on 03/02/2023 and presents today for telephonic preoperative cardiovascular risk assessment.  History of Present Illness    Evelyn Bright is a 67 y.o. female who presents via audio/video conferencing for a telehealth visit today.  Pt was last seen in cardiology clinic on 09/01/2022 by Gillian Shields, NP.  At that time Evelyn Bright was doing well from a cardiac standpoint.  The patient is now pending procedure as outlined above. Since her last visit, she continues to do well. Patient denies shortness of breath or dyspnea on exertion. No chest pain, pressure, or tightness. Denies lower extremity edema, orthopnea, or PND. Occasional palpitations that are unchanged from previous. She remains active walking 2 miles at least once a week and performing light to moderate household tasks.   Past  Medical History    Past Medical History:  Diagnosis Date   Atypical chest pain 11/08/2020   Frozen shoulder left   did PT   Osteopenia    Palpitations 11/08/2020   Pure hypercholesterolemia 11/08/2020   Past Surgical History:  Procedure Laterality Date   CHOLECYSTECTOMY  07/2006   CYSTECTOMY Left    paratubal cyst removal    Allergies  Allergies  Allergen Reactions   Compazine [Prochlorperazine]    Minocin [Minocycline]    Naproxen Rash   Prednisone Nausea Only and Rash   Sulfa Antibiotics Rash    Home Medications    Prior to Admission medications   Medication Sig Start Date End Date Taking? Authorizing Provider  nitrofurantoin, macrocrystal-monohydrate, (MACROBID) 100 MG capsule Take 1 capsule (100 mg total) by mouth 2 (two) times daily. 01/27/23   Jerene Bears, MD  ondansetron (ZOFRAN) 4 MG tablet Take 1 tablet (4 mg total) by mouth every 6 (six) hours. 01/25/23   Blue, Soijett A, PA-C  tamsulosin (FLOMAX) 0.4 MG CAPS capsule Take 1 capsule (0.4 mg total) by mouth daily. 01/25/23   Blue, Soijett A, PA-C  VITAMIN D PO Take by mouth.    [provider]    Physical Exam    Vital Signs:  Evelyn Bright does not have vital signs available for review today.  Given telephonic nature of communication, physical exam is limited. AAOx3. NAD. Normal affect.  Speech and respirations are unlabored.  Accessory Clinical Findings    None  Assessment & Plan    Primary Cardiologist: Chilton Si, MD  Preoperative cardiovascular risk assessment.  Cystoscopy, ureteroscopy, retrograde pyelogram, laser  litho-, stent by Dr. Arita Miss on 03/02/2023.  Chart reviewed as part of pre-operative protocol coverage. According to the RCRI, patient has a 0.4% risk of MACE. Patient reports activity equivalent to >4.0 METS (walks 2 miles at least 1 week, performs light to moderate household activites).   Given past medical history and time since last visit, based on ACC/AHA guidelines,  Evelyn Bright would be at acceptable risk for the planned procedure without further cardiovascular testing.   Patient was advised that if she develops new symptoms prior to surgery to contact our office to arrange a follow-up appointment.  she verbalized understanding.   I will route this recommendation to the requesting party via Epic fax function.  Please call with questions.  Time:   Today, I have spent 6 minutes with the patient with telehealth technology discussing medical history, symptoms, and management plan.     Carlos Levering, NP  02/25/2023, 8:26 AM

## 2023-02-26 ENCOUNTER — Encounter (HOSPITAL_BASED_OUTPATIENT_CLINIC_OR_DEPARTMENT_OTHER): Payer: Self-pay | Admitting: Urology

## 2023-02-26 NOTE — Progress Notes (Signed)
Spoke w/ via phone for pre-op interview--- pt Lab needs dos----  no             Lab results------ current EKG in epic/ chart COVID test -----patient states asymptomatic no test needed Arrive at -------  1215 on 03-02-2023 NPO after MN NO Solid Food.  Clear liquids from MN until--- 1115 Med rec completed Medications to take morning of surgery ----- none Diabetic medication ----- n/a Patient instructed no nail polish to be worn day of surgery Patient instructed to bring photo id and insurance card day of surgery Patient aware to have Driver (ride ) / caregiver    for 24 hours after surgery --- daughter, Deanna Artis Patient Special Instructions ----- n/a Pre-Op special Istructions ----- pt has telephone cardiac clearance by Carlos Levering NP on 02-25-2023 in epic/ chart Patient verbalized understanding of instructions that were given at this phone interview. Patient denies shortness of breath, chest pain, fever, cough at this phone interview.

## 2023-03-02 ENCOUNTER — Ambulatory Visit (HOSPITAL_BASED_OUTPATIENT_CLINIC_OR_DEPARTMENT_OTHER)
Admission: RE | Admit: 2023-03-02 | Discharge: 2023-03-02 | Disposition: A | Discharge status: Home / Self Care | Payer: Medicare HMO | Attending: Urology | Admitting: Urology

## 2023-03-02 ENCOUNTER — Ambulatory Visit (HOSPITAL_BASED_OUTPATIENT_CLINIC_OR_DEPARTMENT_OTHER): Payer: Medicare HMO | Admitting: Anesthesiology

## 2023-03-02 ENCOUNTER — Encounter (HOSPITAL_BASED_OUTPATIENT_CLINIC_OR_DEPARTMENT_OTHER)
Admission: RE | Disposition: A | Discharge status: Home / Self Care | Payer: Self-pay | Source: Home / Self Care | Attending: Urology

## 2023-03-02 ENCOUNTER — Other Ambulatory Visit: Payer: Self-pay

## 2023-03-02 ENCOUNTER — Encounter (HOSPITAL_BASED_OUTPATIENT_CLINIC_OR_DEPARTMENT_OTHER): Payer: Self-pay | Admitting: Urology

## 2023-03-02 DIAGNOSIS — Z87891 Personal history of nicotine dependence: Secondary | ICD-10-CM | POA: Insufficient documentation

## 2023-03-02 DIAGNOSIS — N201 Calculus of ureter: Secondary | ICD-10-CM

## 2023-03-02 DIAGNOSIS — Z01818 Encounter for other preprocedural examination: Secondary | ICD-10-CM

## 2023-03-02 HISTORY — PX: CYSTOSCOPY/URETEROSCOPY/HOLMIUM LASER/STENT PLACEMENT: SHX6546

## 2023-03-02 HISTORY — DX: Frequency of micturition: R35.0

## 2023-03-02 HISTORY — DX: Cyst of kidney, acquired: N28.1

## 2023-03-02 HISTORY — DX: Unspecified osteoarthritis, unspecified site: M19.90

## 2023-03-02 HISTORY — DX: Presence of spectacles and contact lenses: Z97.3

## 2023-03-02 HISTORY — DX: Calculus of ureter: N20.1

## 2023-03-02 HISTORY — DX: Other specified congenital malformations of heart: Q24.8

## 2023-03-02 SURGERY — CYSTOSCOPY/URETEROSCOPY/HOLMIUM LASER/STENT PLACEMENT
Anesthesia: General | Site: Ureter | Laterality: Right

## 2023-03-02 MED ORDER — IOHEXOL 300 MG/ML  SOLN
INTRAMUSCULAR | Status: DC | PRN
  Administered 2023-03-02: 10 mL via URETHRAL

## 2023-03-02 MED ORDER — ACETAMINOPHEN 500 MG PO TABS
ORAL_TABLET | ORAL | Status: AC
  Filled 2023-03-02: qty 2

## 2023-03-02 MED ORDER — ACETAMINOPHEN 500 MG PO TABS
1000.0000 mg | ORAL_TABLET | Freq: Once | ORAL | Status: AC
  Administered 2023-03-02: 1000 mg via ORAL

## 2023-03-02 MED ORDER — SCOPOLAMINE 1 MG/3DAYS TD PT72
1.0000 | MEDICATED_PATCH | TRANSDERMAL | Status: DC
  Administered 2023-03-02: 1.5 mg via TRANSDERMAL

## 2023-03-02 MED ORDER — CEFAZOLIN SODIUM-DEXTROSE 2-4 GM/100ML-% IV SOLN
2.0000 g | INTRAVENOUS | Status: AC
  Administered 2023-03-02: 2 g via INTRAVENOUS

## 2023-03-02 MED ORDER — FENTANYL CITRATE (PF) 100 MCG/2ML IJ SOLN
INTRAMUSCULAR | Status: AC
  Filled 2023-03-02: qty 2

## 2023-03-02 MED ORDER — PROMETHAZINE HCL 25 MG/ML IJ SOLN: 6.2500 mg | INTRAMUSCULAR | Status: DC | PRN

## 2023-03-02 MED ORDER — FENTANYL CITRATE (PF) 250 MCG/5ML IJ SOLN
INTRAMUSCULAR | Status: DC | PRN
  Administered 2023-03-02 (×2): 25 ug via INTRAVENOUS
  Administered 2023-03-02: 50 ug via INTRAVENOUS

## 2023-03-02 MED ORDER — ONDANSETRON HCL 4 MG/2ML IJ SOLN
INTRAMUSCULAR | Status: DC | PRN
  Administered 2023-03-02: 4 mg via INTRAVENOUS

## 2023-03-02 MED ORDER — MIDAZOLAM HCL 2 MG/2ML IJ SOLN
INTRAMUSCULAR | Status: DC | PRN
  Administered 2023-03-02: 2 mg via INTRAVENOUS

## 2023-03-02 MED ORDER — DEXAMETHASONE SODIUM PHOSPHATE 10 MG/ML IJ SOLN
INTRAMUSCULAR | Status: AC
  Filled 2023-03-02: qty 1

## 2023-03-02 MED ORDER — SCOPOLAMINE 1 MG/3DAYS TD PT72
MEDICATED_PATCH | TRANSDERMAL | Status: AC
  Filled 2023-03-02: qty 1

## 2023-03-02 MED ORDER — MIDAZOLAM HCL 2 MG/2ML IJ SOLN
INTRAMUSCULAR | Status: AC
  Filled 2023-03-02: qty 2

## 2023-03-02 MED ORDER — ONDANSETRON HCL 4 MG/2ML IJ SOLN
INTRAMUSCULAR | Status: AC
  Filled 2023-03-02: qty 2

## 2023-03-02 MED ORDER — PROPOFOL 10 MG/ML IV BOLUS
INTRAVENOUS | Status: DC | PRN
  Administered 2023-03-02: 130 mg via INTRAVENOUS

## 2023-03-02 MED ORDER — LACTATED RINGERS IV SOLN: INTRAVENOUS | Status: DC

## 2023-03-02 MED ORDER — FENTANYL CITRATE (PF) 100 MCG/2ML IJ SOLN: 25.0000 ug | INTRAMUSCULAR | Status: DC | PRN

## 2023-03-02 MED ORDER — AMISULPRIDE (ANTIEMETIC) 5 MG/2ML IV SOLN: 10.0000 mg | Freq: Once | INTRAVENOUS | Status: DC | PRN

## 2023-03-02 MED ORDER — DIPHENHYDRAMINE HCL 50 MG/ML IJ SOLN
INTRAMUSCULAR | Status: DC | PRN
  Administered 2023-03-02: 12.5 mg via INTRAVENOUS

## 2023-03-02 MED ORDER — LIDOCAINE 2% (20 MG/ML) 5 ML SYRINGE
INTRAMUSCULAR | Status: DC | PRN
  Administered 2023-03-02: 100 mg via INTRAVENOUS

## 2023-03-02 MED ORDER — SODIUM CHLORIDE 0.9 % IR SOLN
Status: DC | PRN
  Administered 2023-03-02: 3000 mL

## 2023-03-02 MED ORDER — CEFAZOLIN SODIUM-DEXTROSE 2-4 GM/100ML-% IV SOLN
INTRAVENOUS | Status: AC
  Filled 2023-03-02: qty 100

## 2023-03-02 SURGICAL SUPPLY — 21 items
BAG DRAIN URO-CYSTO SKYTR STRL (DRAIN) ×1 IMPLANT
BAG DRN UROCATH (DRAIN) ×1
BASKET ZERO TIP NITINOL 2.4FR (BASKET) IMPLANT
BSKT STON RTRVL ZERO TP 2.4FR (BASKET) ×1
CATH URETL OPEN 5X70 (CATHETERS) ×1 IMPLANT
CLOTH BEACON ORANGE TIMEOUT ST (SAFETY) ×1 IMPLANT
GLOVE BIO SURGEON STRL SZ 6.5 (GLOVE) ×1 IMPLANT
GLOVE BIOGEL PI IND STRL 6 (GLOVE) IMPLANT
GOWN STRL REUS W/TWL LRG LVL3 (GOWN DISPOSABLE) ×1 IMPLANT
GUIDEWIRE STR DUAL SENSOR (WIRE) ×1 IMPLANT
IV NS IRRIG 3000ML ARTHROMATIC (IV SOLUTION) ×1 IMPLANT
KIT TURNOVER CYSTO (KITS) ×1 IMPLANT
MANIFOLD NEPTUNE II (INSTRUMENTS) ×1 IMPLANT
PACK CYSTO (CUSTOM PROCEDURE TRAY) ×1 IMPLANT
SHEATH NAVIGATOR HD 11/13X28 (SHEATH) IMPLANT
SLEEVE SCD COMPRESS KNEE MED (STOCKING) ×1 IMPLANT
STENT URET 6FRX24 CONTOUR (STENTS) IMPLANT
TRACTIP FLEXIVA PULS ID 200XHI (Laser) IMPLANT
TRACTIP FLEXIVA PULSE ID 200 (Laser) ×1
TUBE CONNECTING 12X1/4 (SUCTIONS) ×1 IMPLANT
TUBING UROLOGY SET (TUBING) ×1 IMPLANT

## 2023-03-02 NOTE — Interval H&P Note (Signed)
History and Physical Interval Note:  03/02/2023 1:16 PM  Evelyn Bright  has presented today for surgery, with the diagnosis of URETERAL CALCULI.  The various methods of treatment have been discussed with the patient and family. After consideration of risks, benefits and other options for treatment, the patient has consented to  Procedure(s) with comments: CYSTOSCOPY RIGHT URETEROSCOPY, RIGHT RETROGRADE PYELOGRAM, HOLMIUM LASER LITHOTRIPSY, AND RIGHT URETERAL STENT PLACEMENT (Right) - 60 MINUTES as a surgical intervention.  The patient's history has been reviewed, patient examined, no change in status, stable for surgery.  I have reviewed the patient's chart and labs.  Questions were answered to the patient's satisfaction.     Marguerite Barba D Patches Mcdonnell

## 2023-03-02 NOTE — Anesthesia Preprocedure Evaluation (Addendum)
Anesthesia Evaluation  Patient identified by MRN, date of birth, ID band Patient awake    Reviewed: Allergy & Precautions, NPO status , Patient's Chart, lab work & pertinent test results  History of Anesthesia Complications Negative for: history of anesthetic complications  Airway Mallampati: II  TM Distance: >3 FB Neck ROM: Full    Dental no notable dental hx. (+) Dental Advisory Given   Pulmonary former smoker   Pulmonary exam normal        Cardiovascular negative cardio ROS Normal cardiovascular exam  06/2022 Myo-perfusion study Narrative   The study is normal. The study is low risk.   No ST deviation was noted.   LV perfusion is normal. There is no evidence of ischemia. There is no evidence of infarction.   Left ventricular function is normal. Nuclear stress EF: 79 %. The left ventricular ejection fraction is hyperdynamic (>65%). End diastolic cavity size is normal. End systolic cavity size is normal.   Prior study not available for comparison.   Fair exercise capacity, achieved 7.0 METS   Peak heart rate 173 bpm, 111% max age-predicted heart rate   Normal blood pressure response to exercise    Neuro/Psych negative neurological ROS     GI/Hepatic negative GI ROS, Neg liver ROS,,,  Endo/Other  negative endocrine ROS    Renal/GU      Musculoskeletal negative musculoskeletal ROS (+)    Abdominal   Peds  Hematology negative hematology ROS (+)   Anesthesia Other Findings   Reproductive/Obstetrics                             Anesthesia Physical Anesthesia Plan  ASA: 2  Anesthesia Plan: General   Post-op Pain Management: Tylenol PO (pre-op)* and Toradol IV (intra-op)*   Induction: Intravenous  PONV Risk Score and Plan: 4 or greater and Ondansetron, Dexamethasone, Midazolam and Scopolamine patch - Pre-op  Airway Management Planned: LMA  Additional Equipment:    Intra-op Plan:   Post-operative Plan: Extubation in OR  Informed Consent: I have reviewed the patients History and Physical, chart, labs and discussed the procedure including the risks, benefits and alternatives for the proposed anesthesia with the patient or authorized representative who has indicated his/her understanding and acceptance.     Dental advisory given  Plan Discussed with: Anesthesiologist and CRNA  Anesthesia Plan Comments:        Anesthesia Quick Evaluation

## 2023-03-02 NOTE — Anesthesia Procedure Notes (Signed)
Procedure Name: LMA Insertion Date/Time: 03/02/2023 1:49 PM  Performed by: Dairl Ponder, CRNAPre-anesthesia Checklist: Patient identified, Emergency Drugs available, Suction available and Patient being monitored Patient Re-evaluated:Patient Re-evaluated prior to induction Oxygen Delivery Method: Circle System Utilized Preoxygenation: Pre-oxygenation with 100% oxygen Induction Type: IV induction Ventilation: Mask ventilation without difficulty LMA: LMA inserted LMA Size: 4.0 Number of attempts: 1 Airway Equipment and Method: Bite block Placement Confirmation: positive ETCO2 Tube secured with: Tape Dental Injury: Teeth and Oropharynx as per pre-operative assessment

## 2023-03-02 NOTE — Discharge Instructions (Addendum)
DISCHARGE INSTRUCTIONS FOR KIDNEY STONE/URETERAL STENT   MEDICATIONS:  1. Resume all your other meds from home  2. AZO over the counter can help with the burning/stinging when you urinate. 3. Tylenol and ibuprofen can be used for pain control 4. Tamsulosin can help with stent discomfort    ACTIVITY:  1. No strenuous activity x 1week  2. No driving while on narcotic pain medications  3. Drink plenty of water  4. Continue to walk at home - you can still get blood clots when you are at home, so keep active, but don't over do it.  5. May return to work/school tomorrow or when you feel ready   BATHING:  1. You can shower and we recommend daily showers  2. You have a string coming from your urethra: The stent string is attached to your ureteral stent. Do not pull on this.   SIGNS/SYMPTOMS TO CALL:  Please call us if you have a fever greater than 101.5, uncontrolled nausea/vomiting, uncontrolled pain, dizziness, unable to urinate, bloody urine, chest pain, shortness of breath, leg swelling, leg pain, redness around wound, drainage from wound, or any other concerns or questions.   You can reach [place] at [phone].   FOLLOW-UP:  1. You have a string attached to your stent, you may remove it on Friday, April 19. To do this, pull the stringsuntil the stent is completely removed. You may feel an odd sensation in your back.       No acetaminophen/Tylenol until after 6:45 pm today if needed.     Post Anesthesia Home Care Instructions  Activity: Get plenty of rest for the remainder of the day. A responsible individual must stay with you for 24 hours following the procedure.  For the next 24 hours, DO NOT: -Drive a car -Advertising copywriter -Drink alcoholic beverages -Take any medication unless instructed by your physician -Make any legal decisions or sign important papers.  Meals: Start with liquid foods such as gelatin or soup. Progress to regular foods as tolerated. Avoid greasy,  spicy, heavy foods. If nausea and/or vomiting occur, drink only clear liquids until the nausea and/or vomiting subsides. Call your physician if vomiting continues.  Special Instructions/Symptoms: Your throat may feel dry or sore from the anesthesia or the breathing tube placed in your throat during surgery. If this causes discomfort, gargle with warm salt water. The discomfort should disappear within 24 hours.           [facility] [phone] Post Ureteroscopy With or Without Stent Instructions  Definitions:  Ureter: The duct that transports urine from the kidney to the bladder. Stent:   A plastic hollow tube that is placed into the ureter, from the kidney to the bladder to prevent the ureter from swelling shut.  GENERAL INSTRUCTIONS:  Despite the fact that no skin incisions were used, the area around the ureter and bladder is raw and irritated. The stent is a foreign body which will further irritate the bladder wall. This irritation is manifested by increased frequency of urination, both day and night, and by an increase in the urge to urinate. In some, the urge to urinate is present almost always. Sometimes the urge is strong enough that you may not be able to stop yourself from urinating. The only real cure is to remove the stent and then give time for the bladder wall to heal which can't be done until the danger of the ureter swelling shut has passed, which varies.  You may see some  blood in your urine while the stent is in place and a few days afterwards. Do not be alarmed, even if the urine was clear for a while. Get off your feet and drink lots of fluids until clearing occurs. If you start to pass clots or don't improve, call us.  DIET: You may return to your normal diet immediately. Because of the raw surface of your bladder, alcohol, spicy foods, acid type foods and drinks with caffeine may cause irritation or frequency and should be used in moderation. To keep  your urine flowing freely and to avoid constipation, drink plenty of fluids during the day ( 8-10 glasses ). Tip: Avoid cranberry juice because it is very acidic.  ACTIVITY: Your physical activity doesn't need to be restricted. However, if you are very active, you may see some blood in your urine. We suggest that you reduce your activity under these circumstances until the bleeding has stopped.  BOWELS: It is important to keep your bowels regular during the postoperative period. Straining with bowel movements can cause bleeding. A bowel movement every other day is reasonable. Use a mild laxative if needed, such as Milk of Magnesia 2-3 tablespoons, or 2 Dulcolax tablets. Call if you continue to have problems. If you have been taking narcotics for pain, before, during or after your surgery, you may be constipated. Take a laxative if necessary.   MEDICATION: You should resume your pre-surgery medications unless told not to. In addition you will often be given an antibiotic to prevent infection. These should be taken as prescribed until the bottles are finished unless you are having an unusual reaction to one of the drugs.  PROBLEMS YOU SHOULD REPORT TO [place]: Fevers over 100.5 Fahrenheit. Heavy bleeding, or clots ( See above notes about blood in urine ). Inability to urinate. Drug reactions ( hives, rash, nausea, vomiting, diarrhea ). Severe burning or pain with urination that is not improving.  FOLLOW-UP: You will need a follow-up appointment to monitor your progress. Call for this appointment at the number listed above. Usually the first appointment will be about three to fourteen days after your surgery.

## 2023-03-02 NOTE — Op Note (Signed)
Preoperative diagnosis: right ureteral calculus  Postoperative diagnosis: right ureteral calculus  Procedure:  Cystoscopy right ureteroscopy, laser lithotripsy, basket stone extraction right 53F x 24cm ureteral stent placement  right retrograde pyelography with interpretation  Surgeon: Kasandra Knudsen, MD  Anesthesia: General  Complications: None  Intraoperative findings:  Normal urethra Bilateral orthotropic ureteral orifices right retrograde pyelography demonstrated a filling defect within the right ureter consistent with the patient's known calculus without other abnormalities. Bladder mucosa normal without masses   EBL: Minimal  Specimens: right ureteral calculus  Disposition of specimens: [facility] for stone analysis  Indication: Evelyn Bright is a 67 y.o.   patient with a 6mm right ureteral stone and associated right symptoms. After reviewing the management options for treatment, the patient elected to proceed with the above surgical procedure(s). We have discussed the potential benefits and risks of the procedure, side effects of the proposed treatment, the likelihood of the patient achieving the goals of the procedure, and any potential problems that might occur during the procedure or recuperation. Informed consent has been obtained.   Description of procedure:  The patient was taken to the operating room and general anesthesia was induced.  The patient was placed in the dorsal lithotomy position, prepped and draped in the usual sterile fashion, and preoperative antibiotics were administered. A preoperative time-out was performed.   Cystourethroscopy was performed.  The patient's urethra was examined and was normal.  The bladder was then systematically examined in its entirety. There was no evidence for any bladder tumors, stones, or other mucosal pathology.    Attention then turned to the right ureteral orifice and a ureteral catheter was used to  intubate the ureteral orifice.  Omnipaque contrast was injected through the ureteral catheter and a retrograde pyelogram was performed with findings as dictated above.  A 0.38 sensor guidewire was then advanced up the right ureter into the renal pelvis under fluoroscopic guidance. The 4.5Fr semirigid ureteroscope was then advanced into the ureter next to the guidewire and to the UPJ however no stone was seen.  A second sensor wire was placed through the ureteroscope and the ureteroscope was removed.  One of the wires were secured as a safety wire.  The ureteral access sheath was placed over the second wire and advanced to the proximal ureter with fluoroscopic guidance.  The inner sheath and wire removed.  Flexible ureteroscopy then took place.  The ureteral calculus had been pushed back into the kidney and was easily seen in the midpole. The stone was then fragmented with the 242 micron holmium laser fiber.  The larger stones were then removed from the ureter with an 0 tip basket.  Reinspection of the ureter revealed no remaining visible stones or fragments.   The wire was then backloaded through the cystoscope and a ureteral stent was advance over the wire using Seldinger technique.  The stent was positioned appropriately under fluoroscopic and cystoscopic guidance.  The wire was then removed with an adequate stent curl noted in the renal pelvis as well as in the bladder.  The bladder was then emptied and the procedure ended.  The patient appeared to tolerate the procedure well and without complications.  The patient was able to be awakened and transferred to the recovery unit in satisfactory condition.   Disposition: The tether of the stent was left on and tucked inside the patient's vagina.  Instructions for removing the stent have been provided to the patient.

## 2023-03-02 NOTE — Transfer of Care (Signed)
Immediate Anesthesia Transfer of Care Note  Patient: Evelyn Bright  Procedure(s) Performed: CYSTOSCOPY RIGHT URETEROSCOPY, RIGHT RETROGRADE PYELOGRAM, HOLMIUM LASER LITHOTRIPSY, AND RIGHT URETERAL STENT PLACEMENT (Right: Ureter)  Patient Location: PACU  Anesthesia Type:General  Level of Consciousness: sedated  Airway & Oxygen Therapy: Patient Spontanous Breathing and Patient connected to nasal cannula oxygen  Post-op Assessment: Report given to RN and Post -op Vital signs reviewed and stable  Post vital signs: Reviewed and stable  Last Vitals:  Vitals Value Taken Time  BP 128/86 03/02/23 1424  Temp    Pulse 88 03/02/23 1426  Resp 16 03/02/23 1426  SpO2 95 % 03/02/23 1426  Vitals shown include unvalidated device data.  Last Pain:  Vitals:   03/02/23 1237  TempSrc: Oral  PainSc: 4       Patients Stated Pain Goal: 7 (03/02/23 1237)  Complications: No notable events documented.

## 2023-03-02 NOTE — H&P (Signed)
CC/HPI: cc: urolithiasis   01/29/23: 67 year old woman who developed right lower quadrant pain found to have a 6 mm proximal ureteral calculus here for evaluation. She has been experiencing gross hematuria since August 2023. It is episodic and occurred in August, November and December. More recently when the pain hits she has nausea but no fever, chills or vomiting. She had a pelvic ultrasound done which was normal. CT abdomen pelvis also shows a 9 mm indeterminate lesion in the left upper pole.   02/24/2023:  Patient presents for follow-up and presurgical clearance on her upcoming ureteroscopy for 6 mm proximal ureteral stone on 4/16. Since last seen, patient endorses no new changes to her medical history. She has had no new procedures, diagnoses, medications. Moreover, patient has not experienced recurrence of flank, stone pain. She does endorse a sense of vaginal pressure. She has not seen stone material pass. She is otherwise at baseline voiding function. Nocturia x 0. This week, she experienced some bilateral lower back pain. She inquires whether her stone is still there today.     ALLERGIES: Minocycline naproxen Prednisone Prochlorperazine Sulfa    MEDICATIONS: Tamsulosin Hcl 0.4 mg capsule TAKE 1 CAPSULE BY MOUTH EVERY DAY  Flomax 0.4 mg capsule  Vitamin D2     GU PSH: None   NON-GU PSH: Remove Gallbladder - about 2009 Remove Ovarian Cyst(s) - about 2009     GU PMH: Renal calculus - 01/29/2023 Renal cyst - 01/29/2023 Ureteral calculus - 01/29/2023    NON-GU PMH: Hypercholesterolemia    Immunizations: None   FAMILY HISTORY: Diabetes - Mother, Brother, Sister Heart Disease - Mother, Brother, Father, Grandfather, Grandmother, Sister   SOCIAL HISTORY: Marital Status: Widowed Preferred Language: English; Ethnicity: Not Hispanic Or Latino; Race: White Current Smoking Status: Patient has never smoked.   Tobacco Use Assessment Completed: Used Tobacco in last 30 days? Has never  drank.  Does not drink caffeine.    REVIEW OF SYSTEMS:    GU Review Female:   Patient denies frequent urination, hard to postpone urination, burning /pain with urination, get up at night to urinate, leakage of urine, stream starts and stops, trouble starting your stream, have to strain to urinate, and being pregnant.  Gastrointestinal (Upper):   Patient denies nausea, vomiting, and indigestion/ heartburn.  Gastrointestinal (Lower):   Patient denies diarrhea and constipation.  Constitutional:   Patient denies fever, night sweats, weight loss, and fatigue.  Skin:   Patient denies skin rash/ lesion and itching.  Eyes:   Patient denies blurred vision and double vision.  Ears/ Nose/ Throat:   Patient denies sore throat and sinus problems.  Hematologic/Lymphatic:   Patient denies swollen glands and easy bruising.  Cardiovascular:   Patient denies leg swelling and chest pains.  Respiratory:   Patient denies cough and shortness of breath.  Endocrine:   Patient denies excessive thirst.  Musculoskeletal:   Patient denies back pain and joint pain.  Neurological:   Patient denies headaches and dizziness.  Psychologic:   Patient denies depression and anxiety.   Notes: Low back pain.    VITAL SIGNS:      02/24/2023 01:12 PM  BP 138/89 mmHg  Pulse 109 /min   MULTI-SYSTEM PHYSICAL EXAMINATION:    Constitutional: Well-nourished. No physical deformities. Normally developed. Good grooming.  Respiratory: No labored breathing, no use of accessory muscles. Lungs clear to auscultation bilaterally.  Cardiovascular: Normal temperature, normal extremity pulses, no swelling. Regular rate and rhythm, no murmurs.  Skin: No paleness, no jaundice, no  cyanosis.  Neurologic / Psychiatric: Oriented to time, oriented to place, oriented to person. No depression, no anxiety, no agitation.  Gastrointestinal: No mass, no suprapubic or bilateral CVA tenderness, no rigidity, non obese abdomen.      Complexity of Data:   Source Of History:  Patient, Medical Record Summary  Records Review:   Previous Doctor Records, Previous Patient Records  Urine Test Review:   Urinalysis   02/24/23  Urinalysis  Urine Appearance Clear   Urine Color Yellow   Urine Glucose Neg mg/dL  Urine Bilirubin Neg mg/dL  Urine Ketones Neg mg/dL  Urine Specific Gravity <=1.005   Urine Blood Neg ery/uL  Urine pH 7.5   Urine Protein Neg mg/dL  Urine Urobilinogen 0.2 mg/dL  Urine Nitrites Neg   Urine Leukocyte Esterase Neg leu/uL   PROCEDURES:          Urinalysis Dipstick Dipstick Cont'd  Color: Yellow Bilirubin: Neg mg/dL  Appearance: Clear Ketones: Neg mg/dL  Specific Gravity: <=1.610 Blood: Neg ery/uL  pH: 7.5 Protein: Neg mg/dL  Glucose: Neg mg/dL Urobilinogen: 0.2 mg/dL    Nitrites: Neg    Leukocyte Esterase: Neg leu/uL    ASSESSMENT:      ICD-10 Details  1 GU:   Ureteral calculus - N20.1 Right, Acute, Uncomplicated   PLAN:            Medications Refill Meds: Tamsulosin Hcl 0.4 mg capsule TAKE 1 CAPSULE BY MOUTH EVERY DAY   #30  0 Refill(s)            Schedule Return Visit/Planned Activity: Keep Scheduled Appointment             Note: Ureteroscopy          Document Letter(s):  Created for Patient: Clinical Summary         Notes:   Today, UA WNL. Patient has not had recurrence of previous stone pain, but she has not seen stone material pass either. She is at baseline voiding function.   We discussed with patient kidney stone etiology, prevention, management. We reviewed ureteroscopy with cystoscopy in detail, stent discomfort and removal. Patient is apprehensive as she does not know whether she has passed her stone. We discussed with patient that she has potentially had her stone since 8/23, at initial onset of gross hematuria. As such, her ureter should be evaluated, regardless of presence of stone. Patient voiced understanding.

## 2023-03-03 ENCOUNTER — Encounter (HOSPITAL_COMMUNITY): Payer: Self-pay | Admitting: Emergency Medicine

## 2023-03-03 ENCOUNTER — Other Ambulatory Visit: Payer: Self-pay

## 2023-03-03 ENCOUNTER — Emergency Department (HOSPITAL_COMMUNITY): Payer: Medicare HMO

## 2023-03-03 ENCOUNTER — Emergency Department (HOSPITAL_COMMUNITY)
Admission: EM | Admit: 2023-03-03 | Discharge: 2023-03-03 | Disposition: A | Discharge status: Home / Self Care | Payer: Medicare HMO | Attending: Emergency Medicine | Admitting: Emergency Medicine

## 2023-03-03 DIAGNOSIS — T83122A Displacement of urinary stent, initial encounter: Secondary | ICD-10-CM | POA: Diagnosis not present

## 2023-03-03 DIAGNOSIS — I2510 Atherosclerotic heart disease of native coronary artery without angina pectoris: Secondary | ICD-10-CM | POA: Diagnosis not present

## 2023-03-03 DIAGNOSIS — T83028A Displacement of other indwelling urethral catheter, initial encounter: Secondary | ICD-10-CM | POA: Insufficient documentation

## 2023-03-03 DIAGNOSIS — R319 Hematuria, unspecified: Secondary | ICD-10-CM | POA: Diagnosis not present

## 2023-03-03 DIAGNOSIS — R109 Unspecified abdominal pain: Secondary | ICD-10-CM | POA: Diagnosis not present

## 2023-03-03 LAB — URINALYSIS, W/ REFLEX TO CULTURE (INFECTION SUSPECTED)
Bilirubin Urine: NEGATIVE
Glucose, UA: NEGATIVE mg/dL
Ketones, ur: NEGATIVE mg/dL
Nitrite: NEGATIVE
Protein, ur: 30 mg/dL — AB
RBC / HPF: >50 RBC/hpf (ref 0–5)
Specific Gravity, Urine: 1.005 (ref 1.005–1.030)
pH: 6 (ref 5.0–8.0)

## 2023-03-03 LAB — CBC WITH DIFFERENTIAL/PLATELET
Abs Immature Granulocytes: 0.01 10*3/uL (ref 0.00–0.07)
Basophils Absolute: 0 10*3/uL (ref 0.0–0.1)
Basophils Relative: 1 %
Eosinophils Absolute: 0.1 10*3/uL (ref 0.0–0.5)
Eosinophils Relative: 1 %
HCT: 43.2 % (ref 36.0–46.0)
Hemoglobin: 14.2 g/dL (ref 12.0–15.0)
Immature Granulocytes: 0 %
Lymphocytes Relative: 27 %
Lymphs Abs: 1.7 10*3/uL (ref 0.7–4.0)
MCH: 29.6 pg (ref 26.0–34.0)
MCHC: 32.9 g/dL (ref 30.0–36.0)
MCV: 90.2 fL (ref 80.0–100.0)
Monocytes Absolute: 0.6 10*3/uL (ref 0.1–1.0)
Monocytes Relative: 10 %
Neutro Abs: 3.9 10*3/uL (ref 1.7–7.7)
Neutrophils Relative %: 61 %
Platelets: 214 10*3/uL (ref 150–400)
RBC: 4.79 MIL/uL (ref 3.87–5.11)
RDW: 12.4 % (ref 11.5–15.5)
WBC: 6.3 10*3/uL (ref 4.0–10.5)
nRBC: 0 % (ref 0.0–0.2)

## 2023-03-03 LAB — BASIC METABOLIC PANEL
Anion gap: 6 (ref 5–15)
BUN: 11 mg/dL (ref 8–23)
CO2: 27 mmol/L (ref 22–32)
Calcium: 9.5 mg/dL (ref 8.9–10.3)
Chloride: 104 mmol/L (ref 98–111)
Creatinine, Ser: 0.78 mg/dL (ref 0.44–1.00)
GFR, Estimated: >60 mL/min (ref >60)
Glucose, Bld: 115 mg/dL — ABNORMAL HIGH (ref 70–99)
Potassium: 3.8 mmol/L (ref 3.5–5.1)
Sodium: 137 mmol/L (ref 135–145)

## 2023-03-03 LAB — URINE CULTURE

## 2023-03-03 NOTE — Anesthesia Postprocedure Evaluation (Signed)
Anesthesia Post Note  Patient: Evelyn Bright  Procedure(s) Performed: CYSTOSCOPY RIGHT URETEROSCOPY, RIGHT RETROGRADE PYELOGRAM, HOLMIUM LASER LITHOTRIPSY, AND RIGHT URETERAL STENT PLACEMENT (Right: Ureter)     Patient location during evaluation: PACU Anesthesia Type: General Level of consciousness: sedated Pain management: pain level controlled Vital Signs Assessment: post-procedure vital signs reviewed and stable Respiratory status: spontaneous breathing and respiratory function stable Cardiovascular status: stable Postop Assessment: no apparent nausea or vomiting Anesthetic complications: no   No notable events documented.  Last Vitals:  Vitals:   03/02/23 1500 03/02/23 1600  BP: 127/83 137/84  Pulse: 75 75  Resp: 14 14  Temp:  36.4 C  SpO2: 94% 98%    Last Pain:  Vitals:   03/03/23 1004  TempSrc:   PainSc: 3                  [place] Witz DANIEL

## 2023-03-03 NOTE — ED Triage Notes (Signed)
Pt c/o urinary frequency and incontinence since cytoscopy and ureteral stents placed yesterday. Denies pain. Endorses hematuria.

## 2023-03-03 NOTE — ED Provider Notes (Signed)
[place] EMERGENCY DEPARTMENT AT [facility] Provider Note  CSN: [id number] Arrival date & time: 03/03/23 0256  Chief Complaint(s) Post-op Problem  HPI Evelyn Bright is a 67 y.o. female with a past medical history listed below including renal stones who had lithotripsy and stone extraction with ureteral stent placement yesterday by Dr. Arita Miss presents to the emergency department for urinary incontinence.  She reports that she was doing well after the procedure but sometime this evening began to notice leaking that has not subsided.  She reports that the string for the ureteral stent is hanging low.  She states that she called the on-call provider who recommended coming into the emergency department.  Otherwise she denies any discomfort or other physical complaints.  The history is provided by the patient.    Past Medical History Past Medical History:  Diagnosis Date   Arthritis    Atypical chest pain 11/08/2020   Frequency of urination    Osteopenia    Palpitations 11/08/2020   cardiologist-- dr Karie Schwalbe. Duke Salvia, for palpitations/ atypical chest pain;  cardiac work-up results in epic,  nuclear stress test normal , echo normal, zio monitor normal   Pericardial cyst    evaluated by cardiacthoracic surgeon 09-29-2022 note in epic, likely pericardial cyst or thymic cyst, unclear if the that is what is causing pain but symptoms are improving , plan is to return 6 month and repeat MRI   Pure hypercholesterolemia 11/08/2020   cardiac CT 07-28-2022 w/ calcium score = 19 involving LAD   Renal cyst, left    Right ureteral calculus    Wears glasses    Patient Active Problem List   Diagnosis Date Noted   Microscopic hematuria 09/28/2022   CAD (coronary artery disease) 08/31/2022   HLD (hyperlipidemia) 08/31/2022   Palpitations 11/08/2020   Atypical chest pain 11/08/2020   Pure hypercholesterolemia 11/08/2020   Intramural uterine fibroid 03/27/2018   Home Medication(s) Prior to  Admission medications   Medication Sig Start Date End Date Taking? Authorizing Provider  Cholecalciferol (VITAMIN D3) 50 MCG (2000 UT) TABS Take 1 tablet by mouth daily.    [provider]  nitrofurantoin, macrocrystal-monohydrate, (MACROBID) 100 MG capsule Take 1 capsule (100 mg total) by mouth 2 (two) times daily. Patient not taking: Reported on 02/26/2023 01/27/23   Jerene Bears, MD  ondansetron (ZOFRAN) 4 MG tablet Take 1 tablet (4 mg total) by mouth every 6 (six) hours. Patient not taking: Reported on 02/26/2023 01/25/23   Blue, Soijett A, PA-C  tamsulosin (FLOMAX) 0.4 MG CAPS capsule Take 1 capsule (0.4 mg total) by mouth daily. Patient taking differently: Take 0.4 mg by mouth at bedtime. 01/25/23   Blue, Soijett A, PA-C                                                                                                                                    Allergies Compazine [prochlorperazine], Minocin [minocycline],  Naproxen, Prednisone, and Sulfa antibiotics  Review of Systems Review of Systems As noted in HPI  Physical Exam Vital Signs  I have reviewed the triage vital signs BP (!) 136/92   Pulse 89   Temp (!) 97.4 F (36.3 C) (Oral)   Resp 18   Ht 5\' 2"  (1.575 m)   Wt 72.5 kg   LMP 11/16/2012   SpO2 98%   BMI 29.25 kg/m   Physical Exam Vitals reviewed.  Constitutional:      General: She is not in acute distress.    Appearance: She is well-developed. She is not diaphoretic.  HENT:     Head: Normocephalic and atraumatic.     Right Ear: External ear normal.     Left Ear: External ear normal.     Nose: Nose normal.  Eyes:     General: No scleral icterus.    Conjunctiva/sclera: Conjunctivae normal.  Neck:     Trachea: Phonation normal.  Cardiovascular:     Rate and Rhythm: Normal rate and regular rhythm.  Pulmonary:     Effort: Pulmonary effort is normal. No respiratory distress.     Breath sounds: No stridor.  Abdominal:     General: There is no distension.      Tenderness: There is no abdominal tenderness.  Musculoskeletal:        General: Normal range of motion.     Cervical back: Normal range of motion.  Neurological:     Mental Status: She is alert and oriented to person, place, and time.  Psychiatric:        Behavior: Behavior normal.     ED Results and Treatments Labs (all labs ordered are listed, but only abnormal results are displayed) Labs Reviewed  BASIC METABOLIC PANEL - Abnormal; Notable for the following components:      Result Value   Glucose, Bld 115 (*)    All other components within normal limits  URINALYSIS, W/ REFLEX TO CULTURE (INFECTION SUSPECTED) - Abnormal; Notable for the following components:   Hgb urine dipstick LARGE (*)    Protein, ur 30 (*)    Leukocytes,Ua MODERATE (*)    Bacteria, UA RARE (*)    All other components within normal limits  URINE CULTURE  CBC WITH DIFFERENTIAL/PLATELET                                                                                                                         EKG  EKG Interpretation  Date/Time:    Ventricular Rate:    PR Interval:    QRS Duration:   QT Interval:    QTC Calculation:   R Axis:     Text Interpretation:         Radiology DG Abdomen 1 View  Result Date: 03/03/2023 CLINICAL DATA:  67 year old female status post urinary stent placement yesterday. Woke with pain and urine leaking, query stent dislodgement. EXAM: ABDOMEN - 1 VIEW COMPARISON:  CT Abdomen and Pelvis 01/25/2023. FINDINGS:  AP views at 0404 hours. Double-J type right ureteral stent is protruding from the urethra, and the proximal stent pigtail projects at the mid sacrum. Stable cholecystectomy clips. Right nephrolithiasis and ureteral calculus demonstrated by CT last month are poorly visible radiographically. Negative lung bases. Visible bowel-gas pattern within normal limits. No acute osseous abnormality identified. IMPRESSION: 1. Dislodged right side double-J ureteral stent  protruding from the urethra, proximal pigtail at distal 3rd ureter level. 2. Right nephrolithiasis and ureteral calculus on CT last month are poorly visible radiographically. Electronically Signed   By: Odessa Fleming M.D.   On: 03/03/2023 05:25    Medications Ordered in ED Medications - No data to display                                                                                                                                   Procedures .Foreign Body Removal  Date/Time: 03/03/2023 6:32 AM  Performed by: Nira Conn, MD Authorized by: Nira Conn, MD  Consent: Verbal consent obtained. Consent given by: patient Patient understanding: patient states understanding of the procedure being performed Patient identity confirmed: verbally with patient Intake: urinary tract.  Sedation: Patient sedated: no  Patient restrained: no Complexity: simple 1 objects recovered. Objects recovered: ureteral stent Post-procedure assessment: foreign body removed    (including critical care time)  Medical Decision Making / ED Course  Click here for ABCD2, HEART and other calculators  Medical Decision Making Amount and/or Complexity of Data Reviewed Labs: ordered. Decision-making details documented in ED Course. Radiology: ordered and independent interpretation performed. Decision-making details documented in ED Course.    Patient presents with urinary incontinence. Likely related to stent dislodgment. This was confirmed with KUB showing the distal end of the catheter in the urethra. Screening labs were obtained and were grossly reassuring with CBC that did not show any leukocytosis or anemia; metabolic panel without significant electrolyte derangements or renal sufficiency; UA with leukocytosis and WBCs that are expected and likely inflammatory/reactive due to recent procedure.  No nitrites concerning for infection.  Culture will be sent.  I spoke with Dr. Alvester Morin from urology  who recommended removing the stent.  This was completed at bedside.      Final Clinical Impression(s) / ED Diagnoses Final diagnoses:  Displacement of ureteral stent, initial encounter   The patient appears reasonably screened and/or stabilized for discharge and I doubt any other medical condition or other [facility] requiring further screening, evaluation, or treatment in the ED at this time. I have discussed the findings, Dx and Tx plan with the patient/family who expressed understanding and agree(s) with the plan. Discharge instructions discussed at length. The patient/family was given strict return precautions who verbalized understanding of the instructions. No further questions at time of discharge.  Disposition: Discharge  Condition: Good  ED Discharge Orders     None        Follow Up: Noel Christmas, MD  [address] [place] [address] [phone]  Call  as needed           This chart was dictated using voice recognition software.  Despite best efforts to proofread,  errors can occur which can change the documentation meaning.    Nira Conn, MD 03/03/23 [id number]

## 2023-03-04 ENCOUNTER — Encounter (HOSPITAL_BASED_OUTPATIENT_CLINIC_OR_DEPARTMENT_OTHER): Payer: Self-pay | Admitting: Obstetrics & Gynecology

## 2023-03-04 LAB — URINE CULTURE: Culture: NO GROWTH

## 2023-03-05 ENCOUNTER — Encounter: Payer: Self-pay | Admitting: Thoracic Surgery (Cardiothoracic Vascular Surgery)

## 2023-03-05 DIAGNOSIS — N201 Calculus of ureter: Secondary | ICD-10-CM | POA: Diagnosis not present

## 2023-03-08 ENCOUNTER — Telehealth: Payer: Self-pay | Admitting: *Deleted

## 2023-03-08 DIAGNOSIS — R1084 Generalized abdominal pain: Secondary | ICD-10-CM | POA: Diagnosis not present

## 2023-03-08 DIAGNOSIS — R300 Dysuria: Secondary | ICD-10-CM | POA: Diagnosis not present

## 2023-03-08 DIAGNOSIS — R3121 Asymptomatic microscopic hematuria: Secondary | ICD-10-CM | POA: Diagnosis not present

## 2023-03-08 NOTE — Telephone Encounter (Signed)
     Patient  visit on 03/03/2023  at [place]  was for treatment   Have you been able to follow up with your primary care physician? patient has been to pc today and goes back for a ct tomorrow she has her medicines and needed transportation  The patient was able to obtain any needed medicine or equipment.  Are there diet recommendations that you are having difficulty following?  Patient expresses understanding of discharge instructions and education provided has no other needs at this time. yes  Evelyn Bright [facility] [place], Population Health [phone] [address] [place] , [place] [address] Email : [email or web address]

## 2023-03-09 DIAGNOSIS — N200 Calculus of kidney: Secondary | ICD-10-CM | POA: Diagnosis not present

## 2023-03-09 DIAGNOSIS — K5730 Diverticulosis of large intestine without perforation or abscess without bleeding: Secondary | ICD-10-CM | POA: Diagnosis not present

## 2023-03-14 ENCOUNTER — Ambulatory Visit
Admission: RE | Admit: 2023-03-14 | Discharge: 2023-03-14 | Disposition: A | Discharge status: Home / Self Care | Payer: Medicare HMO | Source: Ambulatory Visit | Attending: Urology | Admitting: Urology

## 2023-03-14 ENCOUNTER — Ambulatory Visit
Admission: RE | Admit: 2023-03-14 | Discharge: 2023-03-14 | Disposition: A | Discharge status: Home / Self Care | Payer: Medicare HMO | Source: Ambulatory Visit | Attending: Thoracic Surgery (Cardiothoracic Vascular Surgery) | Admitting: Thoracic Surgery (Cardiothoracic Vascular Surgery)

## 2023-03-14 DIAGNOSIS — N281 Cyst of kidney, acquired: Secondary | ICD-10-CM

## 2023-03-14 DIAGNOSIS — N289 Disorder of kidney and ureter, unspecified: Secondary | ICD-10-CM | POA: Diagnosis not present

## 2023-03-14 DIAGNOSIS — Q248 Other specified congenital malformations of heart: Secondary | ICD-10-CM

## 2023-03-14 DIAGNOSIS — J9859 Other diseases of mediastinum, not elsewhere classified: Secondary | ICD-10-CM | POA: Diagnosis not present

## 2023-03-14 MED ORDER — GADOPICLENOL 0.5 MMOL/ML IV SOLN
8.0000 mL | Freq: Once | INTRAVENOUS | Status: AC | PRN
  Administered 2023-03-14: 8 mL via INTRAVENOUS

## 2023-03-17 ENCOUNTER — Ambulatory Visit (HOSPITAL_BASED_OUTPATIENT_CLINIC_OR_DEPARTMENT_OTHER): Payer: Medicare HMO | Admitting: Cardiovascular Disease

## 2023-03-17 ENCOUNTER — Other Ambulatory Visit (HOSPITAL_COMMUNITY)
Admission: RE | Admit: 2023-03-17 | Discharge: 2023-03-17 | Disposition: A | Discharge status: Home / Self Care | Payer: Medicare HMO | Source: Ambulatory Visit | Attending: Obstetrics & Gynecology | Admitting: Obstetrics & Gynecology

## 2023-03-17 ENCOUNTER — Ambulatory Visit (INDEPENDENT_AMBULATORY_CARE_PROVIDER_SITE_OTHER): Payer: Medicare HMO | Admitting: *Deleted

## 2023-03-17 ENCOUNTER — Encounter (HOSPITAL_BASED_OUTPATIENT_CLINIC_OR_DEPARTMENT_OTHER): Payer: Self-pay | Admitting: Cardiovascular Disease

## 2023-03-17 VITALS — Ht 62.0 in | Wt 159.0 lb

## 2023-03-17 VITALS — BP 114/72 | HR 109 | Ht 62.0 in | Wt 155.1 lb

## 2023-03-17 DIAGNOSIS — I25118 Atherosclerotic heart disease of native coronary artery with other forms of angina pectoris: Secondary | ICD-10-CM

## 2023-03-17 DIAGNOSIS — R350 Frequency of micturition: Secondary | ICD-10-CM | POA: Diagnosis not present

## 2023-03-17 DIAGNOSIS — E7800 Pure hypercholesterolemia, unspecified: Secondary | ICD-10-CM | POA: Diagnosis not present

## 2023-03-17 DIAGNOSIS — N898 Other specified noninflammatory disorders of vagina: Secondary | ICD-10-CM | POA: Diagnosis not present

## 2023-03-17 DIAGNOSIS — Q248 Other specified congenital malformations of heart: Secondary | ICD-10-CM | POA: Diagnosis not present

## 2023-03-17 LAB — POCT URINALYSIS DIPSTICK
Appearance: NORMAL
Bilirubin, UA: NEGATIVE
Blood, UA: NEGATIVE
Glucose, UA: NEGATIVE
Ketones, UA: NEGATIVE
Leukocytes, UA: NEGATIVE
Nitrite, UA: NEGATIVE
Protein, UA: NEGATIVE
Spec Grav, UA: 1.01 (ref 1.010–1.025)
Urobilinogen, UA: 0.2 E.U./dL
pH, UA: 7 (ref 5.0–8.0)

## 2023-03-17 NOTE — Progress Notes (Signed)
Cardiology Office Note   Date:  03/17/2023   ID:  Evelyn Bright, DOB 08-Aug-1956, MRN [id number]  PCP:  Alyson Reedy, FNP  Cardiologist:   Chilton Si, MD   No chief complaint on file.   History of Present Illness: Evelyn Bright is a 67 y.o. female with hyperlipidemia and palpitations who is here for follow up. She was first seen on 10/08/2020 for the evaluation of palpitations at the request of Alyson Reedy, FNP.     She developed palpations after having Covid. The episodes were happening sporadically so we did not get monitor. She did not get any medication. She had atypical chest pain. We recommended getting a calcium score and she declined. She was hesitant about any medical interventions. She saw Gillian Shields 06/2022. She noted chest pain again and we referred for a calcium score which was 19 and 65th percentile for age and gender. She had a nuclear stress that revealed LVEF 79% and no ischemia. Monitor showed rare PVC and PACs. She had an MRI of the chest which showed a pericardial cyst. She saw Dr. Dorris Fetch 09/2022 who offered observation aspiration +/- ablation and robotic resection. She wanted to continue monitoring for now.   Today, she reports she continues to have palpitations, but notes they have significantly improved since 06/2022. She recently had a uretal stent placed on 4/16. She presented to the ED the next day on 4/17 with post-op complications. She reports having frequency with urgency. She states her stent was removed and further testing was done. She reports her urine specimen was inconclusive and her CT showed renal stones. She also reports lower extremity edema today, which she attributes to walking a lot today and having tied her shoes too tight. She has been managing her blood pressure well. She has lost weight and is unsure if it is due to the surgery. She has been drinking plenty water with lemon. She denies any chest pain, or shortness of breath. No  lightheadedness, headaches, syncope, orthopnea, or PND.  Past Medical History:  Diagnosis Date   Arthritis    Atypical chest pain 11/08/2020   Frequency of urination    Osteopenia    Palpitations 11/08/2020   cardiologist-- dr Karie Schwalbe. Duke Salvia, for palpitations/ atypical chest pain;  cardiac work-up results in epic,  nuclear stress test normal , echo normal, zio monitor normal   Pericardial cyst    evaluated by cardiacthoracic surgeon 09-29-2022 note in epic, likely pericardial cyst or thymic cyst, unclear if the that is what is causing pain but symptoms are improving , plan is to return 6 month and repeat MRI   Pure hypercholesterolemia 11/08/2020   cardiac CT 07-28-2022 w/ calcium score = 19 involving LAD   Renal cyst, left    Right ureteral calculus    Wears glasses     Past Surgical History:  Procedure Laterality Date   CHOLECYSTECTOMY  08/12/2006   COLONOSCOPY     CYSTECTOMY Left 2007   paratubal cyst removal   CYSTOSCOPY/URETEROSCOPY/HOLMIUM LASER/STENT PLACEMENT Right 03/02/2023   Procedure: CYSTOSCOPY RIGHT URETEROSCOPY, RIGHT RETROGRADE PYELOGRAM, HOLMIUM LASER LITHOTRIPSY, AND RIGHT URETERAL STENT PLACEMENT;  Surgeon: Noel Christmas, MD;  Location: [facility];  Service: Urology;  Laterality: Right;     Current Outpatient Medications  Medication Sig Dispense Refill   Cholecalciferol (VITAMIN D3) 50 MCG (2000 UT) TABS Take 1 tablet by mouth daily.     tamsulosin (FLOMAX) 0.4 MG CAPS capsule Take 1 capsule (0.4 mg  total) by mouth daily. (Patient not taking: Reported on 03/17/2023) 30 capsule 0   No current facility-administered medications for this visit.    Allergies:   Compazine [prochlorperazine], Minocin [minocycline], Naproxen, Prednisone, and Sulfa antibiotics    Social History:  The patient  reports that she has quit smoking. Her smoking use included cigarettes. She has never used smokeless tobacco. She reports that she does not drink alcohol and does  not use drugs.   Family History:  The patient's family history includes Congestive Heart Failure in her father; Diabetes in her brother, brother, and sister; Heart attack in her brother, mother, and sister; Heart attack (age of onset: 18) in her brother; Heart disease in her sister; Hypertension in her father; Peripheral Artery Disease in her brother and mother; Pulmonary Hypertension in her brother; Stroke in her father; Valvular heart disease in her mother.    ROS:  Please see the history of present illness.    (+) Palpitations  (+) LE edema  All other systems are reviewed and negative.    PHYSICAL EXAM: VS:  BP 114/72 (BP Location: Right Arm, Patient Position: Sitting)   Pulse (!) 109   Ht 5\' 2"  (1.575 m)   Wt 155 lb 1.6 oz (70.4 kg)   LMP 11/16/2012   SpO2 99%   BMI 28.37 kg/m  , BMI Body mass index is 28.37 kg/m. GENERAL:  Well appearing HEENT:  Pupils equal round and reactive, fundi not visualized, oral mucosa unremarkable NECK:  No jugular venous distention, waveform within normal limits, carotid upstroke brisk and symmetric, no bruits, no thyromegaly LYMPHATICS:  No cervical adenopathy LUNGS:  Clear to auscultation bilaterally HEART:  RRR.  PMI not displaced or sustained,S1 and S2 within normal limits, no S3, no S4, no clicks, no rubs, no murmurs ABD:  Flat, positive bowel sounds normal in frequency in pitch, no bruits, no rebound, no guarding, no midline pulsatile mass, no hepatomegaly, no splenomegaly EXT:  2 plus pulses throughout, no edema, no cyanosis no clubbing SKIN:  No rashes no nodules NEURO:  Cranial nerves II through XII grossly intact, motor grossly intact throughout PSYCH:  Cognitively intact, oriented to person place and time   EKG:  EKG is ordered today. 10/08/2020: Sinus tachycardia.  Rate 105 bpm.  Zio Monitor 07/2022: 8 Day Zio Monitor Quality: Fair.  Baseline artifact. Predominant rhythm: sinus rhythm Average heart rate: 89 bpm Max heart rate: 164  bpm Min heart rate: 51 bpm Pauses >2.5 seconds: none   Rare PACs and PVCs.  No significant arrhythmias.   Stress test 07/28/2022:   The study is normal. The study is low risk.   No ST deviation was noted.   LV perfusion is normal. There is no evidence of ischemia. There is no evidence of infarction.   Left ventricular function is normal. Nuclear stress EF: 79 %. The left ventricular ejection fraction is hyperdynamic (>65%). End diastolic cavity size is normal. End systolic cavity size is normal.   Prior study not available for comparison.   Fair exercise capacity, achieved 7.0 METS   Peak heart rate 173 bpm, 111% max age-predicted heart rate   Normal blood pressure response to exercise  Calcium Scoring 07/28/2022:  IMPRESSION: 1. 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 Lesion is incompletely imaged. Anterior mediastinal teratoma is included in the differential though is felt less likely given well-defined  margins and lack of calcification within visualized portions of the lesion. MRI of the chest is suggested for complete characterization.  Carotid Arterial Duplex 07/15/2022: Summary:  Right Carotid: The extracranial vessels were near-normal with only minimal  wall thickening or plaque.   Left Carotid: The extracranial vessels were near-normal with only minimal  wall thickening or plaque.   Vertebrals:  Bilateral vertebral arteries demonstrate antegrade flow.  Subclavians: Normal flow hemodynamics were seen in bilateral subclavian arteries.    Recent Labs: 07/01/2022: TSH 1.140 09/28/2022: Magnesium 2.4 01/25/2023: ALT 16 03/03/2023: BUN 11; Creatinine, Ser 0.78; Hemoglobin 14.2; Platelets 214; Potassium 3.8; Sodium 137    Lipid Panel    Component Value Date/Time   CHOL 194 09/28/2022 0940   TRIG 89 09/28/2022 0940    HDL 60 09/28/2022 0940   CHOLHDL 3.2 09/28/2022 0940   CHOLHDL 2.9 12/25/2016 1413   VLDL 21 12/25/2016 1413   LDLCALC 118 (H) 09/28/2022 0940      Wt Readings from Last 3 Encounters:  03/17/23 155 lb 1.6 oz (70.4 kg)  03/17/23 159 lb (72.1 kg)  03/03/23 159 lb 14.4 oz (72.5 kg)      ASSESSMENT AND PLAN:  CAD (coronary artery disease) Non-obstructive CAD.  She declines statins.  She has no angina.   Pure hypercholesterolemia She is considering trying a statin.  She will keep thinking about it.  Keep working on diet and exercise.   Pericardial cyst Very large pericardial cyst.  Stable on MRI this week.  She follows with Dr. Dorris Fetch.  She prefers to keep monitoring for now rather than intervene.   Current medicines are reviewed at length with the patient today.  The patient does not have concerns regarding medicines.  The following changes have been made:  no change  Labs/ tests ordered today include:   No orders of the defined types were placed in this encounter.   Disposition:    FU with Kartier Bennison C. Duke Salvia, MD, [facility] in 6 months   I,Rachel Rivera,acting as a Neurosurgeon for Chilton Si, MD.,have documented all relevant documentation on the behalf of Chilton Si, MD,as directed by  Chilton Si, MD while in the presence of Chilton Si, MD.  I, Kassadi Presswood C. Duke Salvia, MD have reviewed all documentation for this visit.  The documentation of the exam, diagnosis, procedures, and orders on 03/17/2023 are all accurate and complete.    Signed, Bennette Hasty C. Duke Salvia, MD, [facility]  03/17/2023 5:42 PM    [facility]

## 2023-03-17 NOTE — Patient Instructions (Signed)
Medication Instructions:  Your physician recommends that you continue on your current medications as directed. Please refer to the Current Medication list given to you today.  *If you need a refill on your cardiac medications before your next appointment, please call your pharmacy*  Lab Work: NONE  Testing/Procedures: NONE  Follow-Up: At [facility], you and your health needs are our priority.  As part of our continuing mission to provide you with exceptional heart care, we have created designated Provider Care Teams.  These Care Teams include your primary Cardiologist (physician) and Advanced Practice Providers (APPs -  Physician Assistants and Nurse Practitioners) who all work together to provide you with the care you need, when you need it.  We recommend signing up for the patient portal called "MyChart".  Sign up information is provided on this After Visit Summary.  MyChart is used to connect with patients for Virtual Visits (Telemedicine).  Patients are able to view lab/test results, encounter notes, upcoming appointments, etc.  Non-urgent messages can be sent to your provider as well.   To learn more about what you can do with MyChart, go to [email or web address].    Your next appointment:   6 month(s)  The format for your next appointment:   In Person  Provider:   Chilton Si MD or Ronn Melena NP

## 2023-03-17 NOTE — Progress Notes (Signed)
Pt presents to office with complaints of vaginal odor and urinary urgency. She recently underwent procedure for kidney stones and reported these symptoms to her urologist. They advised that she contact her GYN provider. Pt instructed on and performed clean catch urine and vaginal self swab. Advised that we would notify her of recommendations/treatment once test has resulted. Pt verbalized understanding.

## 2023-03-17 NOTE — Assessment & Plan Note (Signed)
She is considering trying a statin.  She will keep thinking about it.  Keep working on diet and exercise.

## 2023-03-17 NOTE — Assessment & Plan Note (Signed)
Very large pericardial cyst.  Stable on MRI this week.  She follows with Dr. Dorris Fetch.  She prefers to keep monitoring for now rather than intervene.

## 2023-03-17 NOTE — Assessment & Plan Note (Addendum)
Non-obstructive CAD.  She declines statins.  She has no angina.

## 2023-03-18 LAB — CERVICOVAGINAL ANCILLARY ONLY
Bacterial Vaginitis (gardnerella): NEGATIVE
Candida Vaginitis: NEGATIVE
Comment: NEGATIVE
Comment: NEGATIVE
Comment: NEGATIVE
Trichomonas: NEGATIVE

## 2023-03-19 LAB — URINE CULTURE: Organism ID, Bacteria: NO GROWTH

## 2023-03-23 ENCOUNTER — Ambulatory Visit: Payer: Medicare HMO | Admitting: Thoracic Surgery (Cardiothoracic Vascular Surgery)

## 2023-03-24 ENCOUNTER — Encounter: Payer: Self-pay | Admitting: Thoracic Surgery (Cardiothoracic Vascular Surgery)

## 2023-03-30 ENCOUNTER — Encounter (HOSPITAL_BASED_OUTPATIENT_CLINIC_OR_DEPARTMENT_OTHER): Payer: Self-pay | Admitting: Family Medicine

## 2023-03-30 ENCOUNTER — Ambulatory Visit (INDEPENDENT_AMBULATORY_CARE_PROVIDER_SITE_OTHER): Payer: Medicare HMO | Admitting: Family Medicine

## 2023-03-30 VITALS — BP 151/96 | HR 98 | Ht 62.0 in | Wt 156.9 lb

## 2023-03-30 DIAGNOSIS — R339 Retention of urine, unspecified: Secondary | ICD-10-CM | POA: Diagnosis not present

## 2023-03-30 DIAGNOSIS — R350 Frequency of micturition: Secondary | ICD-10-CM

## 2023-03-30 DIAGNOSIS — D1801 Hemangioma of skin and subcutaneous tissue: Secondary | ICD-10-CM

## 2023-03-30 DIAGNOSIS — Q248 Other specified congenital malformations of heart: Secondary | ICD-10-CM | POA: Diagnosis not present

## 2023-03-30 DIAGNOSIS — R450 Nervousness: Secondary | ICD-10-CM

## 2023-03-30 LAB — POCT URINALYSIS DIPSTICK
Bilirubin, UA: NEGATIVE
Blood, UA: NEGATIVE
Glucose, UA: NEGATIVE
Ketones, UA: NEGATIVE
Leukocytes, UA: NEGATIVE
Nitrite, UA: NEGATIVE
Protein, UA: NEGATIVE
Spec Grav, UA: 1.01 (ref 1.010–1.025)
Urobilinogen, UA: 0.2 E.U./dL
pH, UA: 7 (ref 5.0–8.0)

## 2023-03-30 NOTE — Progress Notes (Unsigned)
Established Patient Office Visit  Subjective   Patient ID: Evelyn Bright, female    DOB: 04/01/1956  Age: 67 y.o. MRN: [id number]  Chief Complaint  Patient presents with   Numbness    Feet tingling, since procedure. Mild lower right back pain. Had a kidney stone removed and is having problems since   Evelyn Bright is a 67 yo female patient who presents for the following concerns:   She reports feeling "jittery." She has a difficult time explaining what this feels like but report that it is a tingling sensation that occasional feeling burning in nature and is intermittent.   She also reports she feels like she cannot completely empty her bladder- "it feels like something is blocking it." Denies dysuria, cloudy urine, dark-colored urine, and urinary urgency. Reports lower back pain on R side. Reports vaginal odor occasionally but it is not as worse as it was after kidney stone removal procedure. She reports she thinks she drinks enough water, about 4-5 bottles a day, and she is a vegetarian and is not sure what caused her kidney stones.   She also reports she feels like she cannot take a deep breath. Denies shortness of breath, chest pain, palpitations, dyspnea. She has imaging done during her kidney stone issues- which showed a pericardial cyst. She reports being followed by Dr. Dorris Fetch for this.   Red papule on nose that has been there for a few years- would like derm referral.   Review of Systems  Constitutional:  Negative for malaise/fatigue.  Eyes:  Negative for blurred vision and double vision.  Respiratory:  Negative for cough and shortness of breath.   Cardiovascular:  Negative for chest pain and palpitations.  Gastrointestinal:  Negative for abdominal pain, nausea and vomiting.  Genitourinary:  Positive for frequency (feels she is not emptying her bladder completely). Negative for flank pain, hematuria and urgency.  Neurological:  Negative for dizziness and weakness.   Psychiatric/Behavioral:  Negative for depression and suicidal ideas. The patient is not nervous/anxious.     Objective:   BP (!) 151/96   Pulse 98   Ht 5\' 2"  (1.575 m)   Wt 156 lb 14.4 oz (71.2 kg)   LMP 11/16/2012   SpO2 100%   BMI 28.70 kg/m  BP Readings from Last 3 Encounters:  03/30/23 (!) 151/96  03/17/23 114/72  03/03/23 (!) 136/92     Physical Exam Constitutional:      Appearance: Normal appearance.  Cardiovascular:     Rate and Rhythm: Normal rate and regular rhythm.     Pulses: Normal pulses.     Heart sounds: Normal heart sounds, S1 normal and S2 normal.  Pulmonary:     Effort: Pulmonary effort is normal.     Breath sounds: Normal breath sounds.  Abdominal:     General: Bowel sounds are normal.     Palpations: Abdomen is soft.     Tenderness: There is no abdominal tenderness. There is no right CVA tenderness or left CVA tenderness.  Neurological:     Mental Status: She is alert.  Psychiatric:        Mood and Affect: Mood normal.        Behavior: Behavior normal.        Thought Content: Thought content normal.        Judgment: Judgment normal.    Assessment & Plan:  1. Frequency of urination Patient presents with urinary frequency and feeling as if she is unable to empty her  bladder completely. Denies dysuria, abdominal pain, flank pain, fever/chills, and urgency. Patient reports experiencing this feeling around the time of her lithotripsy- done on 03/02/2023. She went to the ED for urinary incontinence due to her ureteral stent becoming dislodged. POCT urinalysis completed and reviewed results with patient. No indication of urinary tract infection (UTI). Abdomen is soft with normal bowel sounds and no tenderness to palpation. No CVA tenderness. Provided patient with reassurance that she does not have UTI. Patient had an unpleasant experience with [facility]. Advised her that I could place another referral to another urologist for further evaluation, but  would like to hold off at this time.  - POCT Urinalysis Dipstick - CBC with Differential/Platelet - Comprehensive metabolic panel  2. Unable to empty bladder See #1  3. Jittery feeling Patient reports feeling jittery since her lithotripsy for her renal stones. Denies change in life stressors, feelings of anxiety, chest pain, heart palpitations, tachycardia, dizziness, syncopal episodes. Patient would like to ensure she is not dealing with a systemic infection. Plan to check her CBC and review her white blood cell count.   4. Pericardial cyst She also reports she feels like she cannot take a deep breath. Denies shortness of breath, chest pain, palpitations, dyspnea. She had MRI w/ and w/out contrast of her chest- which showed a pericardial cyst. She is being followed by Dr. Dorris Fetch for this (seen on 03/23/2023) and patient prefers to keep monitoring for now rather than have a procedure performed. Patient presents today with well-controlled blood pressure. Patient in no acute distress and is well-appearing. Cardiovascular exam with heart regular rate and rhythm. Normal heart sounds, no murmurs present. No lower extremity edema present. Lungs clear to auscultation bilaterally. Has f/u appt with cardiology in 6 months.   5. Cherry hemangioma Physical exam with small, red papule on the tip of her nose. Patient reports it has been there for years and would like a referral to dermatology to assess this and perform skin cancer screening.    Return if symptoms worsen or fail to improve.    Alyson Reedy, FNP

## 2023-03-31 ENCOUNTER — Encounter (HOSPITAL_BASED_OUTPATIENT_CLINIC_OR_DEPARTMENT_OTHER): Payer: Self-pay | Admitting: Family Medicine

## 2023-03-31 DIAGNOSIS — D1801 Hemangioma of skin and subcutaneous tissue: Secondary | ICD-10-CM | POA: Insufficient documentation

## 2023-03-31 DIAGNOSIS — R350 Frequency of micturition: Secondary | ICD-10-CM | POA: Insufficient documentation

## 2023-03-31 DIAGNOSIS — R450 Nervousness: Secondary | ICD-10-CM | POA: Insufficient documentation

## 2023-03-31 DIAGNOSIS — R339 Retention of urine, unspecified: Secondary | ICD-10-CM | POA: Insufficient documentation

## 2023-03-31 LAB — CBC WITH DIFFERENTIAL/PLATELET
Basophils Absolute: 0 10*3/uL (ref 0.0–0.2)
Basos: 1 %
EOS (ABSOLUTE): 0.1 10*3/uL (ref 0.0–0.4)
Eos: 1 %
Hematocrit: 43.1 % (ref 34.0–46.6)
Hemoglobin: 14.9 g/dL (ref 11.1–15.9)
Immature Grans (Abs): 0 10*3/uL (ref 0.0–0.1)
Immature Granulocytes: 0 %
Lymphocytes Absolute: 1.5 10*3/uL (ref 0.7–3.1)
Lymphs: 23 %
MCH: 30.3 pg (ref 26.6–33.0)
MCHC: 34.6 g/dL (ref 31.5–35.7)
MCV: 88 fL (ref 79–97)
Monocytes Absolute: 0.6 10*3/uL (ref 0.1–0.9)
Monocytes: 9 %
Neutrophils Absolute: 4.3 10*3/uL (ref 1.4–7.0)
Neutrophils: 66 %
Platelets: 209 10*3/uL (ref 150–450)
RBC: 4.92 x10E6/uL (ref 3.77–5.28)
RDW: 12.8 % (ref 11.7–15.4)
WBC: 6.4 10*3/uL (ref 3.4–10.8)

## 2023-04-01 ENCOUNTER — Other Ambulatory Visit (HOSPITAL_BASED_OUTPATIENT_CLINIC_OR_DEPARTMENT_OTHER): Payer: Self-pay

## 2023-04-01 DIAGNOSIS — R350 Frequency of micturition: Secondary | ICD-10-CM

## 2023-04-02 ENCOUNTER — Telehealth: Payer: Self-pay | Admitting: Cardiovascular Disease

## 2023-04-02 LAB — COMPREHENSIVE METABOLIC PANEL
ALT: 14 IU/L (ref 0–32)
AST: 18 IU/L (ref 0–40)
Albumin/Globulin Ratio: 1.7 (ref 1.2–2.2)
Albumin: 4.5 g/dL (ref 3.9–4.9)
Alkaline Phosphatase: 98 IU/L (ref 44–121)
BUN/Creatinine Ratio: 17 (ref 12–28)
BUN: 13 mg/dL (ref 8–27)
Bilirubin Total: 0.4 mg/dL (ref 0.0–1.2)
CO2: 23 mmol/L (ref 20–29)
Calcium: 10 mg/dL (ref 8.7–10.3)
Chloride: 104 mmol/L (ref 96–106)
Creatinine, Ser: 0.76 mg/dL (ref 0.57–1.00)
Globulin, Total: 2.6 g/dL (ref 1.5–4.5)
Glucose: 100 mg/dL — ABNORMAL HIGH (ref 70–99)
Potassium: 5.6 mmol/L — ABNORMAL HIGH (ref 3.5–5.2)
Sodium: 140 mmol/L (ref 134–144)
Total Protein: 7.1 g/dL (ref 6.0–8.5)
eGFR: 87 mL/min/{1.73_m2} (ref >59)

## 2023-04-02 NOTE — Telephone Encounter (Signed)
Patient states not pain or pressure but "twinges". She states jittery, tingling, burning in feet and legs and seems to be worse not better.  She states deep breath is difficult.  HR 110 but then will drop down.  She states last week was in 60-70  She has seen PCP this week and did labs. She discussed the jittering as well as urinary issues and derm issues at PCP apppt.  She wanted to be seen Monday but nothing available  Offered first available at [facility] or possibly [facility].  She states "forget it, I need a new doctor, this is ridiculous." I again stated I could look at another provider and she states "It wont make any difference anyway. I know I have something wrong."  She does have appt with Dorris Fetch on 6/4. She ended call

## 2023-04-02 NOTE — Telephone Encounter (Signed)
Pt c/o of Chest Pain: STAT if CP now or developed within 24 hours  1. Are you having CP right now?  No  2. Are you experiencing any other symptoms (ex. SOB, nausea, vomiting, sweating)?  Has been jittery/nervous, has tingling and burning in feet/legs, progressively worsening, can't get a deep breath but no SOB  3. How long have you been experiencing CP?  Past few weeks  4. Is your CP continuous or coming and going?  Coming and going   5. Have you taken Nitroglycerin?  ?

## 2023-04-05 ENCOUNTER — Telehealth (HOSPITAL_BASED_OUTPATIENT_CLINIC_OR_DEPARTMENT_OTHER): Payer: Self-pay | Admitting: Family Medicine

## 2023-04-05 NOTE — Telephone Encounter (Signed)
Patient complaining about jitteriness, tingling and nervousness getting worse, patient has had labs done and denies chest pains.  Provider did not recommend any medications per last office note

## 2023-04-05 NOTE — Telephone Encounter (Signed)
Questions about medicine

## 2023-04-16 DIAGNOSIS — N281 Cyst of kidney, acquired: Secondary | ICD-10-CM | POA: Diagnosis not present

## 2023-04-16 DIAGNOSIS — R300 Dysuria: Secondary | ICD-10-CM | POA: Diagnosis not present

## 2023-04-16 DIAGNOSIS — N200 Calculus of kidney: Secondary | ICD-10-CM | POA: Diagnosis not present

## 2023-04-16 DIAGNOSIS — R102 Pelvic and perineal pain: Secondary | ICD-10-CM | POA: Diagnosis not present

## 2023-04-20 ENCOUNTER — Ambulatory Visit: Payer: Medicare HMO | Admitting: Thoracic Surgery (Cardiothoracic Vascular Surgery)

## 2023-04-20 ENCOUNTER — Other Ambulatory Visit (HOSPITAL_BASED_OUTPATIENT_CLINIC_OR_DEPARTMENT_OTHER): Payer: Self-pay | Admitting: Family Medicine

## 2023-04-20 ENCOUNTER — Encounter: Payer: Self-pay | Admitting: Thoracic Surgery (Cardiothoracic Vascular Surgery)

## 2023-04-20 VITALS — BP 129/80 | HR 100 | Resp 20 | Ht 62.0 in | Wt 156.0 lb

## 2023-04-20 DIAGNOSIS — Q248 Other specified congenital malformations of heart: Secondary | ICD-10-CM | POA: Diagnosis not present

## 2023-04-20 DIAGNOSIS — R450 Nervousness: Secondary | ICD-10-CM

## 2023-04-20 NOTE — Progress Notes (Signed)
[address]       Evelyn Bright [id number]             [phone]     HPI:  Evelyn Bright is a 67 year old woman with a history of hyperlipidemia, osteopenia, palpitations, mediastinal cyst, chest pain, and nephrolithiasis.  She presented last year with chest pain.  She had a cardiac workup including a CT which showed calcium score of 19 but was found to have a middle cyst.  On MR imaging features were consistent with a benign pericardial cyst.  In the interim since her last visit she had a lithotripsy for renal stones.  She required a stent.  She says that ever since then she has been having pins-and-needles numbness in her feet and her left arm.  Also has been having some issues with vision in her left eye.  Past Medical History:  Diagnosis Date   Arthritis    Atypical chest pain 11/08/2020   Frequency of urination    Osteopenia    Palpitations 11/08/2020   cardiologist-- dr Karie Schwalbe. [facility], for palpitations/ atypical chest pain;  cardiac work-up results in epic,  nuclear stress test normal , echo normal, zio monitor normal   Pericardial cyst    evaluated by cardiacthoracic surgeon 09-29-2022 note in epic, likely pericardial cyst or thymic cyst, unclear if the that is what is causing pain but symptoms are improving , plan is to return 6 month and repeat MRI   Pure hypercholesterolemia 11/08/2020   cardiac CT 07-28-2022 w/ calcium score = 19 involving LAD   Renal cyst, left    Right ureteral calculus    Wears glasses     Current Outpatient Medications  Medication Sig Dispense Refill   Cholecalciferol (VITAMIN D3) 50 MCG (2000 UT) TABS Take 1 tablet by mouth daily.     No current facility-administered medications for this visit.    Physical Exam BP 129/80   Pulse 100   Resp 20   Ht 5\' 2"  (1.575 m)   Wt 156 lb (70.8 kg)   LMP 11/16/2012   SpO2 98% Comment: RA  BMI 28.2 kg/m  67 year old woman in no acute distress, but anxious Alert and oriented x  3, no motor deficit  Diagnostic Tests: MR CHEST WITH AND WITHOUT CONTRAST   TECHNIQUE: Multiplanar, multisequence MR imaging of the chest was performed before and after the administration of intravenous contrast.   CONTRAST:  8 mL Vueway  gadolinium contrast IV   COMPARISON:  08/07/2022   FINDINGS: Cardiovascular: No significant vascular findings. Normal heart size. No pericardial effusion.   Mediastinum/Nodes: No enlarged mediastinal, hilar, or axillary lymph nodes. Fluid signal cystic lesion about the left aspect of the heart and middle mediastinum, measuring 9.4 x 9.1 x 4.1 cm unchanged when measured with similar technique (series 9, image 15, series 4, image 17). Thyroid gland, trachea, and esophagus demonstrate no significant findings.   Limited lungs/Pleura: No obvious abnormality of the lungs, assessment very limited by MR. No pleural effusion or pneumothorax.   Upper Abdomen: No acute abnormality.   Musculoskeletal: No chest wall abnormality. No acute osseous findings. Unchanged diffusely enhancing lesion of the T10 vertebral body (series 34, image 29).   IMPRESSION: 1. Unchanged cystic lesion about the left aspect of the heart and middle mediastinum, consistent with a benign pericardial duplication cyst. 2. Unchanged diffusely enhancing lesion of the T10 vertebral body, likely a benign vertebral body hemangioma.     Electronically Signed  By: Jearld Lesch M.D.   On: 03/16/2023 16:49 I personally reviewed the MR images.  No change in the cystic lesion in the mediastinum.  Impression: Evelyn Bright is a 67 year old woman with a history of hyperlipidemia, osteopenia, palpitations, mediastinal cyst, chest pain, and nephrolithiasis.  Middle mediastinal cyst-unchanged on MRI.  Findings consistent with a benign pericardial cyst.  I do not think any of her symptoms are related to the cyst.  She does occasionally have some chest discomfort when she lies on her  left side, but other than that it is not really having any chest pain issues.  If she were to start having chest pain we could certainly resect this cyst at any time.  I again offered there that option but there is nothing pressing to do so and no reason to suspect it is going to improve any of her symptoms.    She is having some unusual paresthesias in her feet and left arm.  Also has had some pain in the back of her head and her left.  She dates all of this to the time of her lithotripsy and ureteral stent back in April.  Am not sure that is related at all but it has been 6 weeks now and her symptoms are not resolving.  I recommended she check back with her primary about getting a referral to neurology to try and find out what the underlying cause of that is.  Plan: Check with primary care regarding neurology referral Return in 1 year with MR chest  I spent over 20 minutes in review of records, images, and in consultation with [name] today Loreli Slot, MD [facility] [phone]

## 2023-05-03 ENCOUNTER — Encounter (HOSPITAL_BASED_OUTPATIENT_CLINIC_OR_DEPARTMENT_OTHER): Payer: Self-pay | Admitting: Family Medicine

## 2023-05-03 ENCOUNTER — Other Ambulatory Visit (HOSPITAL_BASED_OUTPATIENT_CLINIC_OR_DEPARTMENT_OTHER): Payer: Self-pay

## 2023-05-03 ENCOUNTER — Ambulatory Visit (INDEPENDENT_AMBULATORY_CARE_PROVIDER_SITE_OTHER): Payer: Medicare HMO | Admitting: Family Medicine

## 2023-05-03 ENCOUNTER — Other Ambulatory Visit (HOSPITAL_BASED_OUTPATIENT_CLINIC_OR_DEPARTMENT_OTHER): Payer: Self-pay | Admitting: Family Medicine

## 2023-05-03 ENCOUNTER — Telehealth (HOSPITAL_BASED_OUTPATIENT_CLINIC_OR_DEPARTMENT_OTHER): Payer: Self-pay

## 2023-05-03 VITALS — BP 124/88 | HR 90 | Ht 62.0 in | Wt 152.0 lb

## 2023-05-03 DIAGNOSIS — R450 Nervousness: Secondary | ICD-10-CM

## 2023-05-03 DIAGNOSIS — Z0000 Encounter for general adult medical examination without abnormal findings: Secondary | ICD-10-CM

## 2023-05-03 NOTE — Progress Notes (Signed)
Annual Wellness Visit     Patient: Evelyn Bright, Female    DOB: January 01, 1956, 67 y.o.   MRN: [id number]  Subjective  Chief Complaint  Patient presents with   Medicare Visit    Pt is here for her initial welcome to medicare visit.    Evelyn Bright is a 67 y.o. female who presents today for her Annual Wellness Visit. She reports consuming a general diet.  Patient reports remaining generally active, walking occasionally.  She generally feels fairly well, has had concern related to "jitteriness" for which she is in the process of arranging evaluation with neurology. She reports sleeping fairly well. She does not have additional problems to discuss today.   Past Medical History:  Diagnosis Date   Arthritis    Atypical chest pain 11/08/2020   Frequency of urination    Osteopenia    Palpitations 11/08/2020   cardiologist-- dr Karie Schwalbe. [facility], for palpitations/ atypical chest pain;  cardiac work-up results in epic,  nuclear stress test normal , echo normal, zio monitor normal   Pericardial cyst    evaluated by cardiacthoracic surgeon 09-29-2022 note in epic, likely pericardial cyst or thymic cyst, unclear if the that is what is causing pain but symptoms are improving , plan is to return 6 month and repeat MRI   Pure hypercholesterolemia 11/08/2020   cardiac CT 07-28-2022 w/ calcium score = 19 involving LAD   Renal cyst, left    Right ureteral calculus    Wears glasses    Past Surgical History:  Procedure Laterality Date   CHOLECYSTECTOMY  08/12/2006   COLONOSCOPY     CYSTECTOMY Left 2007   paratubal cyst removal   CYSTOSCOPY/URETEROSCOPY/HOLMIUM LASER/STENT PLACEMENT Right 03/02/2023   Procedure: CYSTOSCOPY RIGHT URETEROSCOPY, RIGHT RETROGRADE PYELOGRAM, HOLMIUM LASER LITHOTRIPSY, AND RIGHT URETERAL STENT PLACEMENT;  Surgeon: Noel Christmas, MD;  Location: [facility];  Service: Urology;  Laterality: Right;   Social History   Tobacco Use   Smoking status: Former     Years: 1    Types: Cigarettes   Smokeless tobacco: Never  Vaping Use   Vaping Use: Never used  Substance Use Topics   Alcohol use: No   Drug use: Never   Family History  Problem Relation Age of Onset   Diabetes Sister    Heart disease Sister    Diabetes Brother    Pulmonary Hypertension Brother    Heart attack Brother 56   Peripheral Artery Disease Brother    Heart attack Brother        12 heart attacks and open heart surgery   Diabetes Brother    Heart attack Mother        heart surgery/staph infection   Peripheral Artery Disease Mother    Valvular heart disease Mother    Heart attack Sister        open heart surgery   Congestive Heart Failure Father    Stroke Father    Hypertension Father    Breast cancer Neg Hx    Allergies  Allergen Reactions   Compazine [Prochlorperazine]     Per pt unknown - "couldn't turn head around"   Minocin [Minocycline]     Per pt felt very strange / weird   Naproxen Rash   Prednisone Nausea Only and Rash   Sulfa Antibiotics Rash      Medications: Outpatient Medications Prior to Visit  Medication Sig   Cholecalciferol (VITAMIN D3) 50 MCG (2000 UT) TABS Take 1 tablet by  mouth daily.   No facility-administered medications prior to visit.    Allergies  Allergen Reactions   Compazine [Prochlorperazine]     Per pt unknown - "couldn't turn head around"   Minocin [Minocycline]     Per pt felt very strange / weird   Naproxen Rash   Prednisone Nausea Only and Rash   Sulfa Antibiotics Rash    Patient Care Team: Alyson Reedy, FNP as PCP - General (Family Medicine) Chilton Si, MD as PCP - Cardiology (Cardiology)   Objective  BP 124/88 (BP Location: Right Arm, Patient Position: Sitting, Cuff Size: Normal)   Pulse 90   Ht 5\' 2"  (1.575 m)   Wt 152 lb (68.9 kg)   LMP 11/16/2012   SpO2 100%   BMI 27.80 kg/m    Most recent functional status assessment:    05/02/2023   10:34 PM  In your present state of health, do  you have any difficulty performing the following activities:  Hearing? 0  Difficulty concentrating or making decisions? 0  Dressing or bathing? 0  Doing errands, shopping? 0  Preparing Food and eating ? N  Using the Toilet? N  In the past six months, have you accidently leaked urine? Y  Do you have problems with loss of bowel control? N  Managing your Medications? N  Managing your Finances? N  Housekeeping or managing your Housekeeping? N   Most recent fall risk assessment:    05/03/2023   10:59 AM  Fall Risk   Falls in the past year? 0  Number falls in past yr: 0  Injury with Fall? 0  Risk for fall due to : No Fall Risks  Follow up Falls evaluation completed    Most recent depression screenings:    05/03/2023   10:59 AM 01/27/2023    3:06 PM  PHQ 2/9 Scores  PHQ - 2 Score 0 0  PHQ- 9 Score 6    Most recent cognitive screening:    05/03/2023   11:00 AM  6CIT Screen  What Year? 0 points  What month? 0 points  What time? 0 points  Count back from 20 0 points  Months in reverse 0 points  Repeat phrase 0 points  Total Score 0 points   Most recent Audit-C alcohol use screening    05/02/2023   10:34 PM  Alcohol Use Disorder Test (AUDIT)  1. How often do you have a drink containing alcohol? 0  3. How often do you have six or more drinks on one occasion? 0   A score of 3 or more in women, and 4 or more in men indicates increased risk for alcohol abuse, EXCEPT if all of the points are from question 1   Vision/Hearing Screen: No results found.  No results found for any visits on 05/03/23.    Assessment & Plan   Annual wellness visit done today including the all of the following: Reviewed patient's Family Medical History Reviewed and updated list of patient's medical providers Assessment of cognitive impairment was done Assessed patient's functional ability Established a written schedule for health screening services Health Risk Assessent Completed and  Reviewed  Exercise Activities and Dietary recommendations  Goals   None      There is no immunization history on file for this patient.  Health Maintenance  Topic Date Due   COVID-19 Vaccine (1) Never done   HIV Screening  Never done   DTaP/Tdap/Td (1 - Tdap) Never done   Zoster Vaccines- Shingrix (  1 of 2) Never done   Fecal DNA (Cologuard)  08/21/2022   Pneumonia Vaccine 56+ Years old (1 of 1 - PCV) 09/29/2023 (Originally 06/03/2022)   INFLUENZA VACCINE  06/17/2023   MAMMOGRAM  04/09/2024   Medicare Annual Wellness (AWV)  05/02/2024   PAP SMEAR-Modifier  09/28/2025   DEXA SCAN  Completed   Hepatitis C Screening  Completed   HPV VACCINES  Aged Out   Discussed health benefits of physical activity, and encouraged her to engage in regular exercise appropriate for her age and condition.  Problem List Items Addressed This Visit   None Visit Diagnoses     Encounter for Medicare annual wellness exam    -  Primary   Relevant Orders   EKG 12-Lead     IPPE completed today, reviewed above information with patient in office today no new concerns. Recommend continuing with current treatment plan for ongoing medical issues for which patient has had previous office evaluation. She does have Cologuard at home, she will check to make sure this is not expired, if it is she will let us know and we can send order for new Cologuard test Discussed recommendations related to health maintenance recommendations including screenings as well as immunizations. Recommend continuing with scheduled follow-up with PCP  Giana Castner J De [place], MD

## 2023-05-03 NOTE — Addendum Note (Signed)
Addended by: Wyvonne Lenz on: 05/03/2023 01:24 PM   Modules accepted: Orders

## 2023-05-03 NOTE — Telephone Encounter (Signed)
Patient came in requesting labs be drawn for a b12 and TSH. Let her know insurance may or may not cover these additional labs. Orders have been placed

## 2023-05-04 LAB — TSH RFX ON ABNORMAL TO FREE T4: TSH: 1.03 u[IU]/mL (ref 0.450–4.500)

## 2023-05-04 LAB — VITAMIN B12: Vitamin B-12: 414 pg/mL (ref 232–1245)

## 2023-05-10 ENCOUNTER — Encounter: Payer: Self-pay | Admitting: Neurology

## 2023-05-10 ENCOUNTER — Ambulatory Visit: Payer: Medicare HMO | Admitting: Neurology

## 2023-05-10 VITALS — BP 118/82 | HR 94 | Ht 62.0 in | Wt 152.5 lb

## 2023-05-10 DIAGNOSIS — R450 Nervousness: Secondary | ICD-10-CM | POA: Diagnosis not present

## 2023-05-10 DIAGNOSIS — R202 Paresthesia of skin: Secondary | ICD-10-CM | POA: Diagnosis not present

## 2023-05-10 DIAGNOSIS — R292 Abnormal reflex: Secondary | ICD-10-CM | POA: Diagnosis not present

## 2023-05-10 DIAGNOSIS — R519 Headache, unspecified: Secondary | ICD-10-CM | POA: Diagnosis not present

## 2023-05-10 DIAGNOSIS — M542 Cervicalgia: Secondary | ICD-10-CM

## 2023-05-10 DIAGNOSIS — R200 Anesthesia of skin: Secondary | ICD-10-CM | POA: Diagnosis not present

## 2023-05-10 NOTE — Progress Notes (Signed)
[facility]  PATIENT: Evelyn Bright DOB: [birth year]  REFERRING DOCTOR OR PCP: Alyson Reedy, FNP SOURCE: Patient, notes from primary care, laboratory testing  _________________________________   HISTORICAL  CHIEF COMPLAINT:  Chief Complaint  Patient presents with   Room 10    Pt is here Alone. Pt states that she had a Kidney stone removed in April and that is when her symptoms started. Pt states that she has burning and tingling sensation both arms and legs. Pt states that she has some pressure in the back of her head. Pt states she has a white dot that floats in her left eye. Pt states that she feels shaky inside and out.    HISTORY OF PRESENT ILLNESS:  I had the pleasure seeing your patient, Evelyn Bright, at [facility] for neurologic consultation regarding her dysesthesias in the arms and legs.  She is a 67 year old woman who had noted her feet would become numb over the past few years.  This would be intermittent.  She notes having kidney stone surgery and stent placement April 16th.   Since then, she is reporting more issues with tingling and burning in her legs and to a lesser extent in the arms (more on left).    She notes no major change in bladder.   Balance is fine.    She has noted a white dot floating in her left vision over the last couple months.   She also noted pressure in her head since about that time.    She has some pain in her neck and shoulders, worse since April.     She is feeling more jittery in general though she is not feeling anxious.   She notes a fine tremor in her hands.     She has a large pericardial cyst (stable on MRI).      Labs 04/2023:   B12, TSH, CMP, CBC normal or non-contributory except increased potassium.  REVIEW OF SYSTEMS: Constitutional: No fevers, chills, sweats, or change in appetite Eyes: No visual changes, double vision, eye pain Ear, nose and throat: No hearing loss, ear pain, nasal  congestion, sore throat Cardiovascular: No chest pain, palpitations Respiratory:  No shortness of breath at rest or with exertion.   No wheezes GastrointestinaI: No nausea, vomiting, diarrhea, abdominal pain, fecal incontinence Genitourinary:  No dysuria, urinary retention or frequency.  No nocturia. Musculoskeletal:  No neck pain, back pain Integumentary: No rash, pruritus, skin lesions Neurological: as above Psychiatric: No depression at this time.  No anxiety Endocrine: No palpitations, diaphoresis, change in appetite, change in weigh or increased thirst Hematologic/Lymphatic:  No anemia, purpura, petechiae. Allergic/Immunologic: No itchy/runny eyes, nasal congestion, recent allergic reactions, rashes  ALLERGIES: Allergies  Allergen Reactions   Compazine [Prochlorperazine]     Per pt unknown - "couldn't turn head around"   Minocin [Minocycline]     Per pt felt very strange / weird   Naproxen Rash   Prednisone Nausea Only and Rash   Sulfa Antibiotics Rash    HOME MEDICATIONS:  Current Outpatient Medications:    Cholecalciferol (VITAMIN D3) 50 MCG (2000 UT) TABS, Take 1 tablet by mouth daily., Disp: , Rfl:   PAST MEDICAL HISTORY: Past Medical History:  Diagnosis Date   Arthritis    Atypical chest pain 11/08/2020   Frequency of urination    Osteopenia    Palpitations 11/08/2020   cardiologist-- dr t. Duke Salvia, for palpitations/ atypical chest pain;  cardiac work-up results in epic,  nuclear stress test normal , echo normal, zio monitor normal   Pericardial cyst    evaluated by cardiacthoracic surgeon 09-29-2022 note in epic, likely pericardial cyst or thymic cyst, unclear if the that is what is causing pain but symptoms are improving , plan is to return 6 month and repeat MRI   Pure hypercholesterolemia 11/08/2020   cardiac CT 07-28-2022 w/ calcium score = 19 involving LAD   Renal cyst, left    Right ureteral calculus    Wears glasses     PAST SURGICAL HISTORY: Past  Surgical History:  Procedure Laterality Date   CHOLECYSTECTOMY  08/12/2006   COLONOSCOPY     CYSTECTOMY Left 2007   paratubal cyst removal   CYSTOSCOPY/URETEROSCOPY/HOLMIUM LASER/STENT PLACEMENT Right 03/02/2023   Procedure: CYSTOSCOPY RIGHT URETEROSCOPY, RIGHT RETROGRADE PYELOGRAM, HOLMIUM LASER LITHOTRIPSY, AND RIGHT URETERAL STENT PLACEMENT;  Surgeon: Noel Christmas, MD;  Location: [facility];  Service: Urology;  Laterality: Right;    FAMILY HISTORY: Family History  Problem Relation Age of Onset   Diabetes Sister    Heart disease Sister    Diabetes Brother    Pulmonary Hypertension Brother    Heart attack Brother 30   Peripheral Artery Disease Brother    Heart attack Brother        12 heart attacks and open heart surgery   Diabetes Brother    Heart attack Mother        heart surgery/staph infection   Peripheral Artery Disease Mother    Valvular heart disease Mother    Heart attack Sister        open heart surgery   Congestive Heart Failure Father    Stroke Father    Hypertension Father    Breast cancer Neg Hx     SOCIAL HISTORY: Social History   Socioeconomic History   Marital status: Widowed    Spouse name: Not on file   Number of children: 2   Years of education: Not on file   Highest education level: Not on file  Occupational History   Not on file  Tobacco Use   Smoking status: Former    Years: 1    Types: Cigarettes   Smokeless tobacco: Never  Vaping Use   Vaping Use: Never used  Substance and Sexual Activity   Alcohol use: No   Drug use: Never   Sexual activity: Not Currently    Partners: Male    Birth control/protection: Post-menopausal  Other Topics Concern   Not on file  Social History Narrative   Not on file   Social Determinants of Health   Financial Resource Strain: Not on file  Food Insecurity: Not on file  Transportation Needs: Not on file  Physical Activity: Not on file  Stress: Not on file  Social Connections:  Not on file  Intimate Partner Violence: Not on file       PHYSICAL EXAM  Vitals:   05/10/23 1042  BP: 118/82  Pulse: 94  Weight: 152 lb 8 oz (69.2 kg)  Height: 5\' 2"  (1.575 m)    Body mass index is 27.89 kg/m.   General: The patient is well-developed and well-nourished and in no acute distress  HEENT:  Head is /AT.  Sclera are anicteric.     Neck: No carotid bruits are noted.  The neck is nontender.  Range of motion was normal  Cardiovascular: The heart has a regular rate and rhythm with a normal S1 and S2. There were no murmurs, gallops  or rubs.    Skin: Extremities are without rash or  edema.  Musculoskeletal:  Back is nontender  Neurologic Exam  Mental status: The patient is alert and oriented x 3 at the time of the examination. The patient has apparent normal recent and remote memory, with an apparently normal attention span and concentration ability.   Speech is normal.  Cranial nerves: Extraocular movements are full. Pupils are equal, round, and reactive to light and accomodation.  Color vision was symmetric.  There is good facial sensation to soft touch bilaterally.Facial strength is normal.  Trapezius and sternocleidomastoid strength is normal. No dysarthria is noted.  The tongue is midline, and the patient has symmetric elevation of the soft palate. No obvious hearing deficits are noted.  Motor:  Muscle bulk is normal.   Tone is normal. Strength is  5 / 5 in all 4 extremities.   Sensory: Sensory testing is intact to pinprick, soft touch and vibration sensation in all 4 extremities except 75% vibraiton sensation in toes.   Pinprick was norma  Coordination: Cerebellar testing reveals good finger-nose-finger and heel-to-shin bilaterally.  Gait and station: Station is normal.   Gait is normal. Tandem gait is normal. Romberg is negative.   Reflexes: Deep tendon reflexes are symmetric and 3 in arms, 3+ at knees and ankles (1 beat clonus on right).   Plantar  responses are flexor.    DIAGNOSTIC DATA (LABS, IMAGING, TESTING) - I reviewed patient records, labs, notes, testing and imaging myself where available.  Lab Results  Component Value Date   WBC 6.4 03/30/2023   HGB 14.9 03/30/2023   HCT 43.1 03/30/2023   MCV 88 03/30/2023   PLT 209 03/30/2023      Component Value Date/Time   NA 140 04/01/2023 1643   K 5.6 (H) 04/01/2023 1643   CL 104 04/01/2023 1643   CO2 23 04/01/2023 1643   GLUCOSE 100 (H) 04/01/2023 1643   GLUCOSE 115 (H) 03/03/2023 0409   BUN 13 04/01/2023 1643   CREATININE 0.76 04/01/2023 1643   CREATININE 0.69 12/25/2016 1413   CALCIUM 10.0 04/01/2023 1643   PROT 7.1 04/01/2023 1643   ALBUMIN 4.5 04/01/2023 1643   AST 18 04/01/2023 1643   ALT 14 04/01/2023 1643   ALKPHOS 98 04/01/2023 1643   BILITOT 0.4 04/01/2023 1643   GFRNONAA >60 03/03/2023 0409   GFRAA 88 09/13/2020 1025   Lab Results  Component Value Date   CHOL 194 09/28/2022   HDL 60 09/28/2022   LDLCALC 118 (H) 09/28/2022   TRIG 89 09/28/2022   CHOLHDL 3.2 09/28/2022   Lab Results  Component Value Date   HGBA1C 5.5 09/28/2022   Lab Results  Component Value Date   VITAMINB12 414 05/03/2023   Lab Results  Component Value Date   TSH 1.030 05/03/2023       ASSESSMENT AND PLAN  Numbness and tingling - Plan: MR CERVICAL SPINE WO CONTRAST, Multiple Myeloma Panel (SPEP&IFE w/QIG), Vitamin B12, Sjogren's syndrome antibods(ssa + ssb)  Jittery  Hyperreflexia - Plan: MR CERVICAL SPINE WO CONTRAST, Vitamin B12  Pressure in head  Cervicalgia - Plan: MR CERVICAL SPINE WO CONTRAST   In summary, Ms. Suniga is a 67 year old woman who has noted dysesthesias in the hands and feet that worsened in mid April after she had a surgical procedure.  Additionally, she has a jittery sensation.  Furthermore she has noted a white dot floating around her vision at times.  On examination, she just had minimal reduce  sensation at the toes that is not necessarily  abnormal.  She had mild hyperreflexia in the legs   .  For the dysesthesias, I am most concerned about the possibility of a intrinsic or extrinsic cervical myelopathy.  We need to check an MRI of the cervical spine to determine if there is any compression or other pathology.  This will also allow [place] to evaluate her neck pain.  On exam, she has just very minimal loss of vibration sense at the toes which is not necessarily abnormal.  However, I will check some blood work for treatable causes of neuropathy.  If the symptoms worsen we would need to consider an NCV/EMG study.  I do not have a good explanation for the jitteriness that she is feeling.  It does not seem as though that would be related to her other symptoms.  I did discuss an SSRI or buspirone for anxiety but she would prefer to hold off at this point.  The etiology of the visual symptoms are not clear in color vision was normal today.  She is advised to follow-up with ophthalmology to make sure that there is not a structural cause of her symptoms.  Based on the results of the studies, additional follow-up, referral or treatment may be needed.  She should call if she has significant new or worsening neurologic symptoms.  Thank you for asking [place] to see Ms. Salminen.  Please let me know if I can be of further assistance with her or other patients in the future.   Shanora Christensen A. Epimenio Foot, MD, [facility] 05/10/2023, 10:55 AM Certified in Neurology, Clinical Neurophysiology, Sleep Medicine and Neuroimaging  [facility] [address], [address] [place], [place] [address] [phone]

## 2023-05-11 ENCOUNTER — Telehealth: Payer: Self-pay | Admitting: Neurology

## 2023-05-11 LAB — MULTIPLE MYELOMA PANEL, SERUM

## 2023-05-11 NOTE — Telephone Encounter (Signed)
sent to GI they obtain [employer] medicare auth [id number] 

## 2023-05-13 ENCOUNTER — Telehealth: Payer: Self-pay | Admitting: Neurology

## 2023-05-13 NOTE — Telephone Encounter (Signed)
Called pt. Informed her of message that nurse [facility] sent.

## 2023-05-13 NOTE — Telephone Encounter (Signed)
Phone room: please call back and let her know some results still pending. Once everything back, we will be back in touch to go over results once Dr. Epimenio Foot has reviewed

## 2023-05-13 NOTE — Telephone Encounter (Signed)
Pt called wanting to know when she will be called with her lab results. Pt received them on mychart and was wondering if some results were still not received or if she will be receiving the call soon. Please advise.

## 2023-05-14 LAB — MULTIPLE MYELOMA PANEL, SERUM

## 2023-05-15 LAB — SJOGREN'S SYNDROME ANTIBODS(SSA + SSB)
ENA SSA (RO) Ab: <0.2 AI (ref 0.0–0.9)
ENA SSB (LA) Ab: 1.5 AI — ABNORMAL HIGH (ref 0.0–0.9)

## 2023-05-15 LAB — MULTIPLE MYELOMA PANEL, SERUM
Albumin SerPl Elph-Mcnc: 3.7 g/dL (ref 2.9–4.4)
Albumin/Glob SerPl: 1.2 (ref 0.7–1.7)
Gamma Glob SerPl Elph-Mcnc: 1.2 g/dL (ref 0.4–1.8)
Globulin, Total: 3.2 g/dL (ref 2.2–3.9)

## 2023-05-18 ENCOUNTER — Telehealth: Payer: Self-pay | Admitting: Neurology

## 2023-05-18 ENCOUNTER — Ambulatory Visit
Admission: RE | Admit: 2023-05-18 | Discharge: 2023-05-18 | Disposition: A | Discharge status: Home / Self Care | Payer: Medicare HMO | Source: Ambulatory Visit | Attending: Neurology | Admitting: Neurology

## 2023-05-18 ENCOUNTER — Telehealth: Payer: Self-pay | Admitting: *Deleted

## 2023-05-18 ENCOUNTER — Other Ambulatory Visit: Payer: Self-pay | Admitting: Neurology

## 2023-05-18 DIAGNOSIS — R200 Anesthesia of skin: Secondary | ICD-10-CM

## 2023-05-18 DIAGNOSIS — M542 Cervicalgia: Secondary | ICD-10-CM | POA: Diagnosis not present

## 2023-05-18 DIAGNOSIS — R450 Nervousness: Secondary | ICD-10-CM

## 2023-05-18 DIAGNOSIS — R682 Dry mouth, unspecified: Secondary | ICD-10-CM

## 2023-05-18 DIAGNOSIS — M3500 Sicca syndrome, unspecified: Secondary | ICD-10-CM

## 2023-05-18 DIAGNOSIS — R292 Abnormal reflex: Secondary | ICD-10-CM | POA: Diagnosis not present

## 2023-05-18 DIAGNOSIS — R202 Paresthesia of skin: Secondary | ICD-10-CM | POA: Diagnosis not present

## 2023-05-18 MED ORDER — PILOCARPINE HCL 5 MG PO TABS
ORAL_TABLET | ORAL | 5 refills | Status: DC
Start: 2023-05-18 — End: 2023-07-07

## 2023-05-18 NOTE — Telephone Encounter (Signed)
-----   Message from Asa Lente, MD sent at 05/18/2023 12:54 PM EDT ----- Please let her know that the 1 test was a little elevated (one of the 2 tests for Sjogren's syndrome).  Mildly elevated is not always abnormal.  Please ask her if she has issues with dry mouth and dry eyes.  If she does, I would like her to see rheumatology to assess for the possibility of Sjogren's syndrome (this can cause nerve issues).  The other labs look fine.

## 2023-05-18 NOTE — Telephone Encounter (Signed)
Referral faxed to [facility]. Phone: [phone] Fax: [phone]  

## 2023-05-18 NOTE — Telephone Encounter (Signed)
Left detailed message on pt voicemail per DPR access that Rx has been sent. Referral placed as well.

## 2023-05-18 NOTE — Telephone Encounter (Signed)
Pt asking for call from the nurse concerning results and numbness and tingling.

## 2023-05-18 NOTE — Telephone Encounter (Signed)
Patient informed with below results. She asked if you have any idea of any medication that can help her with the worsen dry mouth? Pt said if you don't know of anything that's fine she thought she would ask.  Please advise

## 2023-05-19 ENCOUNTER — Telehealth: Payer: Self-pay | Admitting: Cardiovascular Disease

## 2023-05-19 NOTE — Telephone Encounter (Signed)
Returned call to patient and reviewed the following recommendations. Patient states the results of her MRI came back normal. She also endorses having 15 epsiodes since we talked this morning. Advised patient that she could be seen in the ED for acute work up and schedule follow up visit with cardiology. Patient states she will not be going back to the ED but will monitor over the weekend and call us Monday if she would like to be seen.     "Sounds more consistent with cervicalgia (neck related pain) for which she is being worked up neurology and had MRI yesterday. Chest pain related to heart is typically with activity and not at rest.   Prior CT showed nonobstructive coronary artery disease. If she is having persistent exertional chest discomfort could consider follow up visit. She was previously offered Metoprolol Succinate 25mg  every day for antianginal benefit but is no longer taking.    Alver Sorrow, NP"

## 2023-05-19 NOTE — Telephone Encounter (Signed)
Pt c/o of Chest Pain: STAT if CP now or developed within 24 hours  1. Are you having CP right now? No but it happened 5 mins ago right before calling.   2. Are you experiencing any other symptoms (ex. SOB, nausea, vomiting, sweating)? No   3. How long have you been experiencing CP? About 3 of 4 days   4. Is your CP continuous or coming and going? Coming and going   5. Have you taken Nitroglycerin? No, because it's so quick    ?

## 2023-05-19 NOTE — Telephone Encounter (Signed)
I  spoke to pt again. She said that she call the [facility] and they have waiting list could be December or January of next year.  She did call [facility] and they may be able to get in sooner after records reviewed. I will forward to [facility], in referrals and let her know. Also she could try zoloft 50mg  po at bedtime.  I relayed to pt and she will think on this and let us know.  [facility] will touch base with her on Monday.  I will hold on referral to another rheumatologist at this time.

## 2023-05-19 NOTE — Telephone Encounter (Signed)
Sounds more consistent with cervicalgia (neck related pain) for which she is being worked up neurology and had MRI yesterday. Chest pain related to heart is typically with activity and not at rest.   Prior CT showed nonobstructive coronary artery disease. If she is having persistent exertional chest discomfort could consider follow up visit. She was previously offered Metoprolol Succinate 25mg  every day for antianginal benefit but is no longer taking.   Alver Sorrow, NP

## 2023-05-19 NOTE — Telephone Encounter (Signed)
Pt returning a call. I spoke to her let her know results.  That rx called in to salagen 5mg  take 2-3 times daily for dry mouth.  She is concerned about waiting (symptoms started 02/2022 after kidney surgery.  She feesl like her body is attacking herself.  This numbness/tingling jitteriness.  I relayed that her referral was marked urgent.  I gave her the # to [facility]  and relayed faxed on 05-18-2023.  She asked if anything could be given to her to relieve her symptoms.  I told her would be glad to ask but may be deferred to rheumatology.  If there was anything more then would call her back but otherwise she understood that if nothing then would not hear back from me.   She verbalized understanding.

## 2023-05-19 NOTE — Telephone Encounter (Signed)
Transferred call from call center,   Patient has been having what she describes a sharp, electrical pain from her heart to her neck. Not happening on the phone, but as recent as 5 minutes ago. She states this has been going about 3-4 days.   Patient has called in about this before. She was sent to neurology for for similar issues. They saw her and referred her to rheumatology. Waiting on referral to rheumatology.   Patient does feel like this is probably related to what she has had going on, but just thought she would double check.

## 2023-05-26 ENCOUNTER — Telehealth: Payer: Self-pay | Admitting: *Deleted

## 2023-05-26 NOTE — Telephone Encounter (Signed)
Called patient to schedule colorectal screening. Pt has cologuard kit at home. Is wondering if colonoscopy is better than cologuard kit. Advised of pros and cons of both cologuard and colonoscopy. Patient will decide whether to do cologuard. If she chooses to do colonoscopy instead of cologuard she will call the office back to have referral placed. There are no transportation issues at this time.

## 2023-05-27 DIAGNOSIS — E663 Overweight: Secondary | ICD-10-CM | POA: Diagnosis not present

## 2023-05-27 DIAGNOSIS — R768 Other specified abnormal immunological findings in serum: Secondary | ICD-10-CM | POA: Diagnosis not present

## 2023-05-27 DIAGNOSIS — R202 Paresthesia of skin: Secondary | ICD-10-CM | POA: Diagnosis not present

## 2023-05-27 DIAGNOSIS — Z6827 Body mass index (BMI) 27.0-27.9, adult: Secondary | ICD-10-CM | POA: Diagnosis not present

## 2023-05-31 ENCOUNTER — Ambulatory Visit (HOSPITAL_BASED_OUTPATIENT_CLINIC_OR_DEPARTMENT_OTHER): Payer: Medicare HMO | Admitting: Cardiovascular Disease

## 2023-06-02 ENCOUNTER — Ambulatory Visit: Payer: Medicare HMO | Admitting: Diagnostic Neuroimaging

## 2023-06-03 ENCOUNTER — Telehealth (HOSPITAL_BASED_OUTPATIENT_CLINIC_OR_DEPARTMENT_OTHER): Payer: Self-pay | Admitting: Family Medicine

## 2023-06-03 NOTE — Telephone Encounter (Signed)
Burning tingling, abdominal pain still, nausea, no answers from neurology no rheumatology and just not feeling well. Patient would like a call back today on how to proceed, she is going out of town tomorrow.

## 2023-06-03 NOTE — Telephone Encounter (Signed)
Pt is calling back she does not want to take Gabapentin or Cymbalta  as the side effects are the saem side effects that she is having ---she is fixing to go to  [place] for 2 weeks and does not want to have a SERIOUS side effect.   Please call the patient

## 2023-06-03 NOTE — Telephone Encounter (Signed)
Spoke with patient, and discussed options. She will look into the medications suggested on her own and let us know if she wishes to move forward with one of the recommended medicines.

## 2023-06-04 MED ORDER — GABAPENTIN 100 MG PO CAPS: 100.0000 mg | ORAL_CAPSULE | Freq: Three times a day (TID) | ORAL | 2 refills | Status: DC

## 2023-06-04 NOTE — Telephone Encounter (Signed)
Please  call in the gabapentin to [facility] in [place]

## 2023-06-22 ENCOUNTER — Telehealth: Payer: Self-pay | Admitting: Neurology

## 2023-06-22 NOTE — Telephone Encounter (Signed)
Pt calling to check up on status of rheumatology results. Would like a call back to discuss next step.

## 2023-06-22 NOTE — Telephone Encounter (Addendum)
I called Rheumatology to get office notes faxed to office. Pt said she doesn't know what else to do. Her labs per patient were normal. Rheumatology told patient to follow up with you. Pt still has same symptoms tingling and burning in arms, legs, back, mouth. Pt would like to know the next step? Nerve conduction? I placed the referral notes on your desk to review. Pt said she just frustrated with her symptoms.  Please advise

## 2023-06-24 ENCOUNTER — Telehealth (HOSPITAL_BASED_OUTPATIENT_CLINIC_OR_DEPARTMENT_OTHER): Payer: Self-pay | Admitting: Family Medicine

## 2023-06-24 NOTE — Telephone Encounter (Signed)
Patient states she wanted you to know her labs are scanned into her chart on 06/15/23 please advise patient how to proceed

## 2023-06-25 ENCOUNTER — Encounter (HOSPITAL_BASED_OUTPATIENT_CLINIC_OR_DEPARTMENT_OTHER): Payer: Self-pay | Admitting: Family Medicine

## 2023-06-25 ENCOUNTER — Encounter: Payer: Self-pay | Admitting: Neurology

## 2023-06-25 ENCOUNTER — Ambulatory Visit (INDEPENDENT_AMBULATORY_CARE_PROVIDER_SITE_OTHER): Payer: Medicare HMO | Admitting: Family Medicine

## 2023-06-25 VITALS — BP 98/77 | HR 101 | Temp 99.0°F | Ht 62.0 in | Wt 142.9 lb

## 2023-06-25 DIAGNOSIS — U071 COVID-19: Secondary | ICD-10-CM | POA: Diagnosis not present

## 2023-06-25 LAB — POC COVID19 BINAXNOW: SARS Coronavirus 2 Ag: POSITIVE — AB

## 2023-06-25 MED ORDER — BENZONATATE 100 MG PO CAPS: 100.0000 mg | ORAL_CAPSULE | Freq: Two times a day (BID) | ORAL | 0 refills | Status: DC | PRN

## 2023-06-25 NOTE — Patient Instructions (Signed)
COVID-19 positive:  Stay home and away from others until symptoms are improving and fever free for 24 hours without the use of tylenol or ibuprofen. If symptoms have improved and fever resolved, then can be in public setting, but should wear a mask around others, practice safe distancing, and perform hand hygiene for an additional 5 days. If symptoms worsen, or the patient develop shortness of breath, pulse oximeter reading of < 90%, or chest pain, seek immediate care at nearest emergency department or call 911.   Vitamin Regimen:  Vitamin C 500mg  twice daily  Vitamin D 5000 units once daily  Zinc 50-75mg  once daily   Over the counter Medications:  Aspirin 81mg  per day * unless allergic or contraindicated*  Use Tylenol (acetaminophen) for fever *unless allergic or contraindicated*    Non-Medication Therapy:  Drink plenty of fluids, warm if possible.   A teaspoon of honey may help ease coughing symptoms.   Cough drops or hard candy for coughing.   Over the Counter Medication Therapy:  Use a cough expectorant such as guaifenesin (Mucinex) if recommended by your doctor for a wet, congested cough. If you have high blood pressure, please ask your doctor first before using this.   Use a cough suppressant such as dextromethorphan (Robitussin/Delsym) for a dry cough. If you have high blood pressure, please ask your doctor first before using this.   If you have high blood pressure, medication such as Coricidin HBP is safe to take for your cough and will not increase your blood pressure.

## 2023-06-25 NOTE — Progress Notes (Signed)
Acute Care Office Visit  Subjective:   Evelyn Bright 06-Oct-1956 06/25/2023  Chief Complaint  Patient presents with   Chills   Headache   Generalized Body Aches    Pt has had complaints including chills, headache, body ache, sore throat, nasal congestion. States symptoms first started about 2 days ago. Pt did travel home via plane from [place] 4 days ago after being there for 2 weeks.    HPI: URI SYMPTOMS: Onset: 2 days  Fever: Yes Runny nose: yes Nasal congestion: yes  Sinus pressure: no Post nasal drip: yes Cough: yes Ear pain: no Sore throat: yes  Pt reports generalized fatigue, abdominal pain. Denies nausea, vomiting or diarrhea.    Treatments tried: None  Recent sick contacts: None. She did recently travel to [place].       The following portions of the patient's history were reviewed and updated as appropriate: past medical history, past surgical history, family history, social history, allergies, medications, and problem list.   Patient Active Problem List   Diagnosis Date Noted   Numbness and tingling 05/10/2023   Hyperreflexia 05/10/2023   Pressure in head 05/10/2023   Frequency of urination 03/31/2023   Unable to empty bladder 03/31/2023   Jittery 03/31/2023   Cherry hemangioma 03/31/2023   Pericardial cyst 03/17/2023   Microscopic hematuria 09/28/2022   CAD (coronary artery disease) 08/31/2022   HLD (hyperlipidemia) 08/31/2022   Palpitations 11/08/2020   Atypical chest pain 11/08/2020   Pure hypercholesterolemia 11/08/2020   Intramural uterine fibroid 03/27/2018   Past Medical History:  Diagnosis Date   Arthritis    Atypical chest pain 11/08/2020   Frequency of urination    Osteopenia    Palpitations 11/08/2020   cardiologist-- dr Karie Schwalbe. [facility], for palpitations/ atypical chest pain;  cardiac work-up results in epic,  nuclear stress test normal , echo normal, zio monitor normal   Pericardial cyst    evaluated by  cardiacthoracic surgeon 09-29-2022 note in epic, likely pericardial cyst or thymic cyst, unclear if the that is what is causing pain but symptoms are improving , plan is to return 6 month and repeat MRI   Pure hypercholesterolemia 11/08/2020   cardiac CT 07-28-2022 w/ calcium score = 19 involving LAD   Renal cyst, left    Right ureteral calculus    Wears glasses    Past Surgical History:  Procedure Laterality Date   CHOLECYSTECTOMY  08/12/2006   COLONOSCOPY     CYSTECTOMY Left 2007   paratubal cyst removal   CYSTOSCOPY/URETEROSCOPY/HOLMIUM LASER/STENT PLACEMENT Right 03/02/2023   Procedure: CYSTOSCOPY RIGHT URETEROSCOPY, RIGHT RETROGRADE PYELOGRAM, HOLMIUM LASER LITHOTRIPSY, AND RIGHT URETERAL STENT PLACEMENT;  Surgeon: Noel Christmas, MD;  Location: [facility];  Service: Urology;  Laterality: Right;   Family History  Problem Relation Age of Onset   Diabetes Sister    Heart disease Sister    Diabetes Brother    Pulmonary Hypertension Brother    Heart attack Brother 54   Peripheral Artery Disease Brother    Heart attack Brother        12 heart attacks and open heart surgery   Diabetes Brother    Heart attack Mother        heart surgery/staph infection   Peripheral Artery Disease Mother    Valvular heart disease Mother    Heart attack Sister        open heart surgery   Congestive Heart Failure Father    Stroke Father  Hypertension Father    Breast cancer Neg Hx    Outpatient Medications Prior to Visit  Medication Sig Dispense Refill   Cholecalciferol (VITAMIN D3) 50 MCG (2000 UT) TABS Take 1 tablet by mouth daily.     gabapentin (NEURONTIN) 100 MG capsule Take 1 capsule (100 mg total) by mouth 3 (three) times daily. (Patient not taking: Reported on 06/25/2023) 90 capsule 2   pilocarpine (SALAGEN) 5 MG tablet One po 2 or 3 times a day (Patient not taking: Reported on 06/25/2023) 90 tablet 5   No facility-administered medications prior to visit.   Allergies   Allergen Reactions   Compazine [Prochlorperazine]     Per pt unknown - "couldn't turn head around"   Minocin [Minocycline]     Per pt felt very strange / weird   Naproxen Rash   Prednisone Nausea Only and Rash   Sulfa Antibiotics Rash     ROS: A complete ROS was performed with pertinent positives/negatives noted in the HPI. The remainder of the ROS are negative.    Objective:   Today's Vitals   06/25/23 1130  BP: 98/77  Pulse: (!) 101  Temp: 99 F (37.2 C)  TempSrc: Oral  SpO2: 99%  Weight: 142 lb 14.4 oz (64.8 kg)  Height: 5\' 2"  (1.575 m)    GENERAL: Well-appearing, in NAD. Well nourished.  SKIN: Pink, warm and dry. No rash, lesion, ulceration, or ecchymoses.  Head: Normocephalic. NECK: Trachea midline. Full ROM w/o pain or tenderness. No lymphadenopathy.  EARS: Tympanic membranes are intact, translucent without bulging and without drainage. Appropriate landmarks visualized.  EYES: Conjunctiva clear without exudates. EOMI, PERRL, no drainage present.  NOSE: Septum midline w/o deformity. Nares patent, mucosa pink and mildly inflamed w/ clear drainage. No sinus tenderness.  THROAT: Uvula midline. Oropharynx clear. Tonsils non-inflamed without exudate. Mucous membranes pink and moist.  RESPIRATORY: Chest wall symmetrical. Respirations even and non-labored. Breath sounds clear to auscultation bilaterally. Cough is congested, non productive.  CARDIAC: S1, S2 present, regular rate and rhythm without murmur or gallops. Peripheral pulses 2+ bilaterally.  MSK: Muscle tone and strength appropriate for age. Joints w/o tenderness, redness, or swelling.  EXTREMITIES: Without clubbing, cyanosis, or edema.  NEUROLOGIC: No motor or sensory deficits. Steady, even gait. C2-C12 intact.  PSYCH/MENTAL STATUS: Alert, oriented x 3. Cooperative, appropriate mood and affect.    Results for orders placed or performed in visit on 06/25/23  POC COVID-19 BinaxNow  Result Value Ref Range   SARS  Coronavirus 2 Ag Positive (A) Negative      Assessment & Plan:  1. COVID-19 + COVID. Mild to moderate symptoms per patient and upon exam. Will treat symptomatically with Vit D, C, Zinc, clear fluids, OTC Tylenol, and will send in Tessalon perles. Discussed symptoms to go to ER with and she verbalized understanding.    - POC COVID-19 BinaxNow   Meds ordered this encounter  Medications   benzonatate (TESSALON) 100 MG capsule    Sig: Take 1 capsule (100 mg total) by mouth 2 (two) times daily as needed for cough.    Dispense:  20 capsule    Refill:  0    Order Specific Question:   Supervising Provider    Answer:   DE Peru, RAYMOND J [[id number]]   Lab Orders         POC COVID-19 BinaxNow   Return if symptoms worsen or fail to improve.    Patient to reach out to office if new, worrisome, or unresolved symptoms  arise or if no improvement in patient's condition. Patient verbalized understanding and is agreeable to treatment plan. All questions answered to patient's satisfaction.   Of note, portions of this note may have been created with voice recognition software Physicist, medical). While this note has been edited for accuracy, occasional wrong-word or 'sound-a-like' substitutions may have occurred due to the inherent limitations of voice recognition software.  Yolanda Manges, FNP

## 2023-06-28 NOTE — Telephone Encounter (Signed)
error 

## 2023-07-05 NOTE — Telephone Encounter (Signed)
Pt asking for a call back to discuss results.

## 2023-07-07 ENCOUNTER — Encounter: Payer: Self-pay | Admitting: Neurology

## 2023-07-07 ENCOUNTER — Ambulatory Visit: Payer: Medicare HMO | Admitting: Neurology

## 2023-07-07 VITALS — BP 118/68 | Ht 62.0 in | Wt 145.0 lb

## 2023-07-07 DIAGNOSIS — R102 Pelvic and perineal pain: Secondary | ICD-10-CM | POA: Diagnosis not present

## 2023-07-07 DIAGNOSIS — H04129 Dry eye syndrome of unspecified lacrimal gland: Secondary | ICD-10-CM

## 2023-07-07 DIAGNOSIS — G5602 Carpal tunnel syndrome, left upper limb: Secondary | ICD-10-CM

## 2023-07-07 DIAGNOSIS — R202 Paresthesia of skin: Secondary | ICD-10-CM

## 2023-07-07 DIAGNOSIS — R200 Anesthesia of skin: Secondary | ICD-10-CM

## 2023-07-07 DIAGNOSIS — N200 Calculus of kidney: Secondary | ICD-10-CM | POA: Diagnosis not present

## 2023-07-07 DIAGNOSIS — K146 Glossodynia: Secondary | ICD-10-CM

## 2023-07-07 DIAGNOSIS — R1084 Generalized abdominal pain: Secondary | ICD-10-CM | POA: Diagnosis not present

## 2023-07-07 DIAGNOSIS — N281 Cyst of kidney, acquired: Secondary | ICD-10-CM | POA: Diagnosis not present

## 2023-07-07 NOTE — Progress Notes (Signed)
[facility]  PATIENT: Evelyn Bright DOB: October 08, 1956  REFERRING DOCTOR OR PCP: Alyson Reedy, FNP SOURCE: Patient, notes from primary care, laboratory testing  _________________________________   HISTORICAL  CHIEF COMPLAINT:  Chief Complaint  Patient presents with   Follow-up    Rm 11, office visit to discuss results,     HISTORY OF PRESENT ILLNESS:  Evelyn Bright is a 67 y.o. woman with dysesthesias in the arms and legs.  UPDATE 07/07/2023 Since the last visit, she has had MRI of the cervical spine showing a normal spinal cord and mild spinal stenosis.   Serology tests showed mildly elevated SSB ([place]).  She also reported dry eyes but not as much dry mouth.  She uses eye drops Her tongue does feel burnt.     She was referred to rheumatology and Sjogren's was felt to be unlikely.    They tested ESR, CRP, Vit D and Lyme titer and they were negative or normal.   We had tested B12, SPEP/IEF which were normal.   She reports the numbness is worse.  She feel it in the back pf her neck, calf, thighs, mouth/throat.   Her tongue feels burnt.   Also on fingertips. Th numbness is painful at times.   When sensory symptoms are worse she feels lightheaded  She does not note weakness  She had Covid-19 3 weeks ago.   History of Numbness She   noted her feet would become numb since 2021.  This would be intermittent.  She notes having kidney stone surgery and stent placement 03/02/2023.   Since then, she is reporting more issues with tingling and burning in her legs and to a lesser extent in the arms (more on left).    She notes no major change in bladder.   Balance is fine.    She has noted a white dot floating in her left vision over the last couple months.   She also noted pressure in her head since about that time.    She has some pain in her neck and shoulders, worse since April.     She is feeling more jittery in general though she is not feeling anxious.   She notes a  fine tremor in her hands.     She has a large pericardial cyst (stable on MRI).      Labs 04/2023:   B12, TSH, CMP, CBC normal or non-contributory except increased potassium.  REVIEW OF SYSTEMS: Constitutional: No fevers, chills, sweats, or change in appetite Eyes: No visual changes, double vision, eye pain Ear, nose and throat: No hearing loss, ear pain, nasal congestion, sore throat Cardiovascular: No chest pain, palpitations Respiratory:  No shortness of breath at rest or with exertion.   No wheezes GastrointestinaI: No nausea, vomiting, diarrhea, abdominal pain, fecal incontinence Genitourinary:  No dysuria, urinary retention or frequency.  No nocturia. Musculoskeletal:  No neck pain, back pain Integumentary: No rash, pruritus, skin lesions Neurological: as above Psychiatric: No depression at this time.  No anxiety Endocrine: No palpitations, diaphoresis, change in appetite, change in weigh or increased thirst Hematologic/Lymphatic:  No anemia, purpura, petechiae. Allergic/Immunologic: No itchy/runny eyes, nasal congestion, recent allergic reactions, rashes  ALLERGIES: Allergies  Allergen Reactions   Compazine [Prochlorperazine]     Per pt unknown - "couldn't turn head around"   Minocin [Minocycline]     Per pt felt very strange / weird   Naproxen Rash   Prednisone Nausea Only and Rash   Sulfa Antibiotics Rash  HOME MEDICATIONS:  Current Outpatient Medications:    Cholecalciferol (VITAMIN D3) 50 MCG (2000 UT) TABS, Take 1 tablet by mouth daily., Disp: , Rfl:    benzonatate (TESSALON) 100 MG capsule, Take 1 capsule (100 mg total) by mouth 2 (two) times daily as needed for cough., Disp: 20 capsule, Rfl: 0   gabapentin (NEURONTIN) 100 MG capsule, Take 1 capsule (100 mg total) by mouth 3 (three) times daily. (Patient not taking: Reported on 06/25/2023), Disp: 90 capsule, Rfl: 2   pilocarpine (SALAGEN) 5 MG tablet, One po 2 or 3 times a day (Patient not taking: Reported on  06/25/2023), Disp: 90 tablet, Rfl: 5  PAST MEDICAL HISTORY: Past Medical History:  Diagnosis Date   Arthritis    Atypical chest pain 11/08/2020   Frequency of urination    Osteopenia    Palpitations 11/08/2020   cardiologist-- dr Karie Schwalbe. [facility], for palpitations/ atypical chest pain;  cardiac work-up results in epic,  nuclear stress test normal , echo normal, zio monitor normal   Pericardial cyst    evaluated by cardiacthoracic surgeon 09-29-2022 note in epic, likely pericardial cyst or thymic cyst, unclear if the that is what is causing pain but symptoms are improving , plan is to return 6 month and repeat MRI   Pure hypercholesterolemia 11/08/2020   cardiac CT 07-28-2022 w/ calcium score = 19 involving LAD   Renal cyst, left    Right ureteral calculus    Wears glasses     PAST SURGICAL HISTORY: Past Surgical History:  Procedure Laterality Date   CHOLECYSTECTOMY  08/12/2006   COLONOSCOPY     CYSTECTOMY Left 2007   paratubal cyst removal   CYSTOSCOPY/URETEROSCOPY/HOLMIUM LASER/STENT PLACEMENT Right 03/02/2023   Procedure: CYSTOSCOPY RIGHT URETEROSCOPY, RIGHT RETROGRADE PYELOGRAM, HOLMIUM LASER LITHOTRIPSY, AND RIGHT URETERAL STENT PLACEMENT;  Surgeon: Noel Christmas, MD;  Location: [facility];  Service: Urology;  Laterality: Right;    FAMILY HISTORY: Family History  Problem Relation Age of Onset   Diabetes Sister    Heart disease Sister    Diabetes Brother    Pulmonary Hypertension Brother    Heart attack Brother 2   Peripheral Artery Disease Brother    Heart attack Brother        12 heart attacks and open heart surgery   Diabetes Brother    Heart attack Mother        heart surgery/staph infection   Peripheral Artery Disease Mother    Valvular heart disease Mother    Heart attack Sister        open heart surgery   Congestive Heart Failure Father    Stroke Father    Hypertension Father    Breast cancer Neg Hx     SOCIAL HISTORY: Social History    Socioeconomic History   Marital status: Widowed    Spouse name: Not on file   Number of children: 2   Years of education: Not on file   Highest education level: Not on file  Occupational History   Not on file  Tobacco Use   Smoking status: Former    Types: Cigarettes   Smokeless tobacco: Never  Vaping Use   Vaping status: Never Used  Substance and Sexual Activity   Alcohol use: No   Drug use: Never   Sexual activity: Not Currently    Partners: Male    Birth control/protection: Post-menopausal  Other Topics Concern   Not on file  Social History Narrative   Left handed  Caffeine-none   Social Determinants of Health   Financial Resource Strain: Not on file  Food Insecurity: Not on file  Transportation Needs: No Transportation Needs (05/26/2023)   PRAPARE - Administrator, Civil Service (Medical): No    Lack of Transportation (Non-Medical): No  Physical Activity: Not on file  Stress: Not on file  Social Connections: Not on file  Intimate Partner Violence: Not on file       PHYSICAL EXAM  Vitals:   07/07/23 1545  BP: 118/68  Weight: 145 lb (65.8 kg)  Height: 5\' 2"  (1.575 m)    Body mass index is 26.52 kg/m.   General: The patient is well-developed and well-nourished and in no acute distress  HEENT:  Head is [place]/AT.  Sclera are anicteric.     Neck:The neck is nontender.  Range of motion was normal  Skin: Extremities are without rash or  edema.  Neurologic Exam  Mental status: The patient is alert and oriented x 3 at the time of the examination. The patient has apparent normal recent and remote memory, with an apparently normal attention span and concentration ability.   Speech is normal.  Cranial nerves: Extraocular movements are full..Facial strength is normal.  Trapezius and sternocleidomastoid strength is normal. No dysarthria is noted.  The tongue is midline, and the patient has symmetric elevation of the soft palate. No obvious hearing  deficits are noted.  Motor:  Muscle bulk is normal.   Tone is normal. Strength is  5 / 5 in all 4 extremities except 4+/5 left APB muscle.   Sensory: Sensory testing is intact to pinprick, soft touch and vibration sensation in all 4 extremities except 75% vibraiton sensation in toes.   Pinprick was norma  Other: Tinel's sign was mildly positive on the left.  Phalen sign was positive on the left.  Coordination: Cerebellar testing reveals good finger-nose-finger and heel-to-shin bilaterally.  Gait and station: Station is normal.   Gait is normal. Tandem gait is normal. Romberg is negative.   Reflexes: Deep tendon reflexes are symmetric and 3 in arms, 3+ at knees and ankles but no ankle clonus     DIAGNOSTIC DATA (LABS, IMAGING, TESTING) - I reviewed patient records, labs, notes, testing and imaging myself where available.  Lab Results  Component Value Date   WBC 6.4 03/30/2023   HGB 14.9 03/30/2023   HCT 43.1 03/30/2023   MCV 88 03/30/2023   PLT 209 03/30/2023      Component Value Date/Time   NA 140 04/01/2023 1643   K 5.6 (H) 04/01/2023 1643   CL 104 04/01/2023 1643   CO2 23 04/01/2023 1643   GLUCOSE 100 (H) 04/01/2023 1643   GLUCOSE 115 (H) 03/03/2023 0409   BUN 13 04/01/2023 1643   CREATININE 0.76 04/01/2023 1643   CREATININE 0.69 12/25/2016 1413   CALCIUM 10.0 04/01/2023 1643   PROT 6.9 05/10/2023 1125   ALBUMIN 4.5 04/01/2023 1643   AST 18 04/01/2023 1643   ALT 14 04/01/2023 1643   ALKPHOS 98 04/01/2023 1643   BILITOT 0.4 04/01/2023 1643   GFRNONAA >60 03/03/2023 0409   GFRAA 88 09/13/2020 1025   Lab Results  Component Value Date   CHOL 194 09/28/2022   HDL 60 09/28/2022   LDLCALC 118 (H) 09/28/2022   TRIG 89 09/28/2022   CHOLHDL 3.2 09/28/2022   Lab Results  Component Value Date   HGBA1C 5.5 09/28/2022   Lab Results  Component Value Date   VITAMINB12 414  05/03/2023   Lab Results  Component Value Date   TSH 1.030 05/03/2023       ASSESSMENT  AND PLAN  Numbness and tingling - Plan: NCV with EMG(electromyography), Sjogren's syndrome antibods(ssa + ssb)  Left carpal tunnel syndrome - Plan: NCV with EMG(electromyography)  Dry eye - Plan: Sjogren's syndrome antibods(ssa + ssb)  Burning tongue - Plan: Sjogren's syndrome antibods(ssa + ssb)   No definite etiology of her symptoms.  The MRI of the cervical spine did not show any spinal cord pathology.  Although the SSB was low positive and she does have dry eyes, she was felt by rheumatology not to have Sjogren's syndrome.  Some patients with that can have sensory symptoms but they tend to be mostly length-dependent and her pattern is distal and proximal and also in her mouth. We will check NCV/EMG in the left arm and leg.  She might have mild left carpal tunnel syndrome with and this will help [place] rule out polyneuropathy. Advised to start the gabapentin.  We can increase the dose if well-tolerated.  This may help her dysesthesias as well as the burning tongue. Return in 4 to 6 months or sooner if there are new or worsening neurologic symptoms.   Nolin Grell A. Epimenio Foot, MD, [facility] 07/07/2023, 5:39 PM Certified in Neurology, Clinical Neurophysiology, Sleep Medicine and Neuroimaging  [facility] [address] [place], [place] [address] [phone]

## 2023-07-08 LAB — SJOGREN'S SYNDROME ANTIBODS(SSA + SSB)
ENA SSA (RO) Ab: <0.2 AI (ref 0.0–0.9)
ENA SSB (LA) Ab: 1.6 AI — ABNORMAL HIGH (ref 0.0–0.9)

## 2023-07-14 IMAGING — MG MM DIGITAL SCREENING BILAT W/ TOMO AND CAD
8 of 14 series · 8 of 40 positions shown · non-contrast
Comparison: Previous exam(s).

ACR Breast Density Category a: The breast tissue is almost entirely
fatty.

CLINICAL DATA: Screening.

EXAM:
DIGITAL SCREENING BILATERAL MAMMOGRAM WITH TOMOSYNTHESIS AND CAD
TECHNIQUE: Bilateral screening digital craniocaudal and mediolateral oblique
mammograms were obtained. Bilateral screening digital breast
tomosynthesis was performed. The images were evaluated with
computer-aided detection.

[R CC synth-2D (1 of 2)]
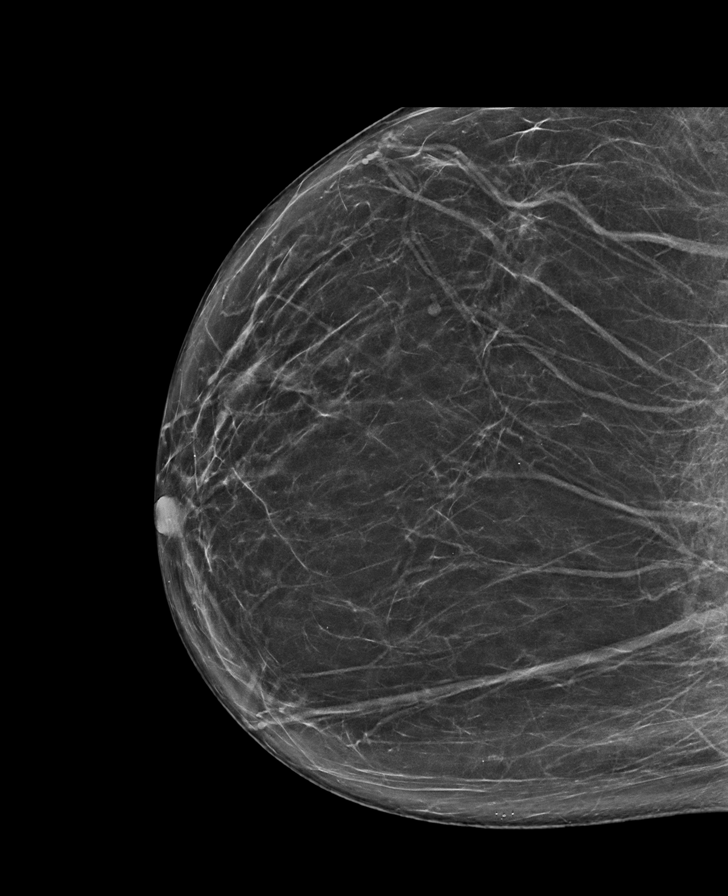

[L MLO synth-2D (1 of 2)]
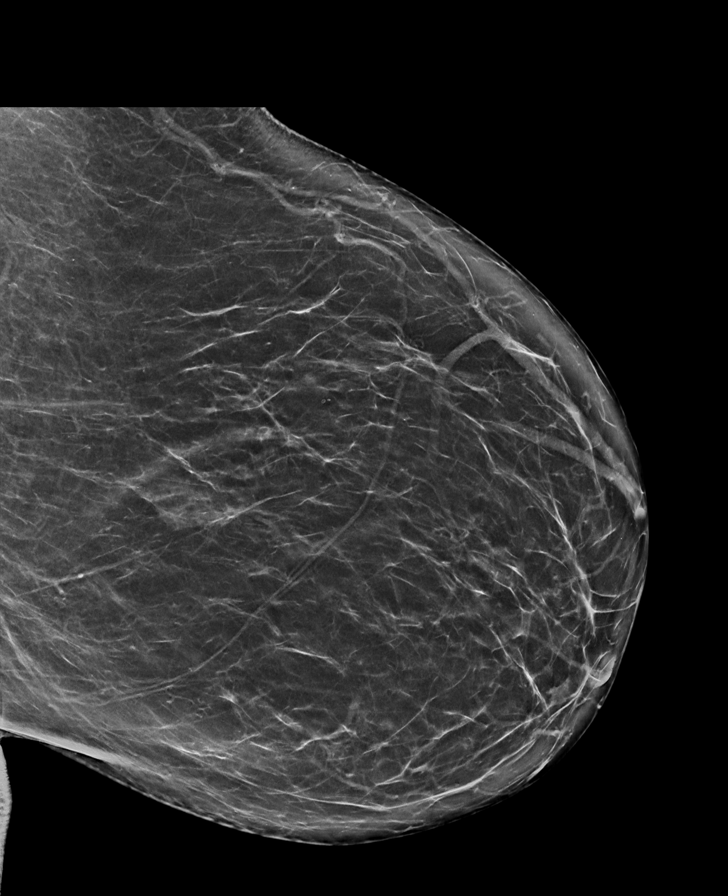

[R CC synth-2D (2 of 2)]
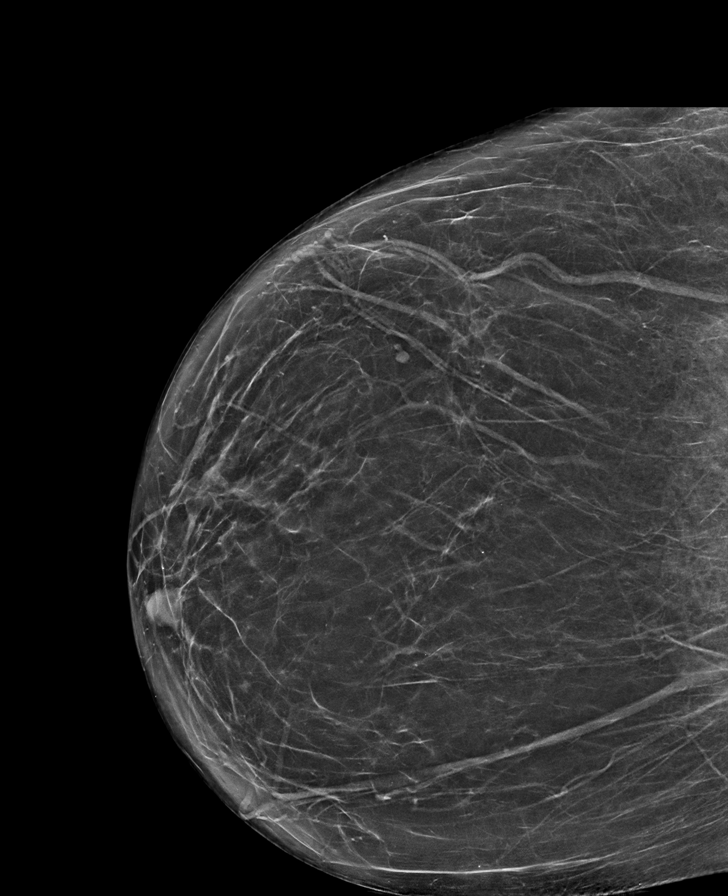

[L MLO synth-2D (2 of 2)]
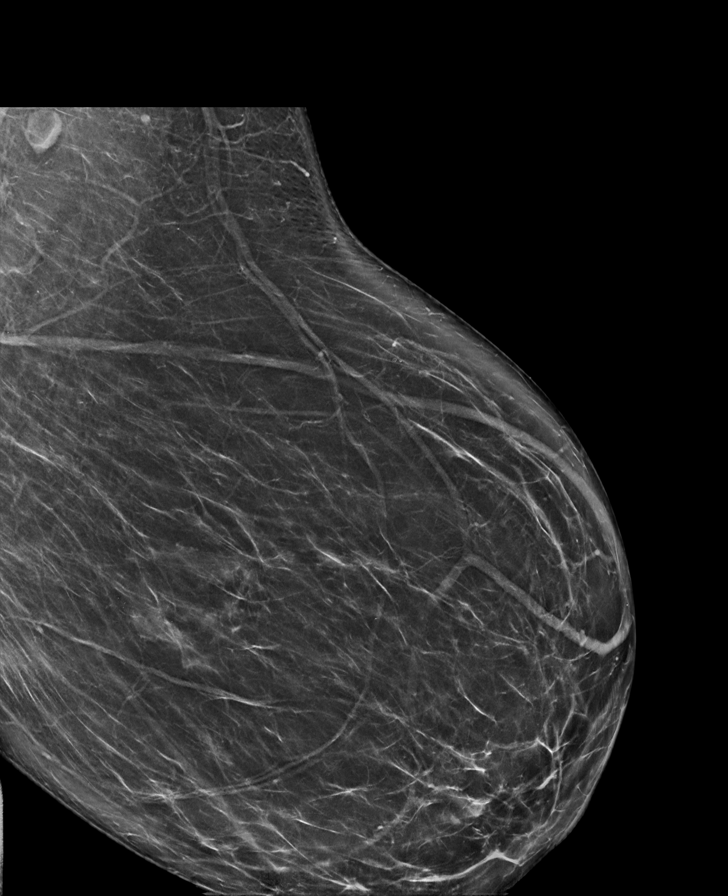

[R MLO synth-2D (1 of 2)]
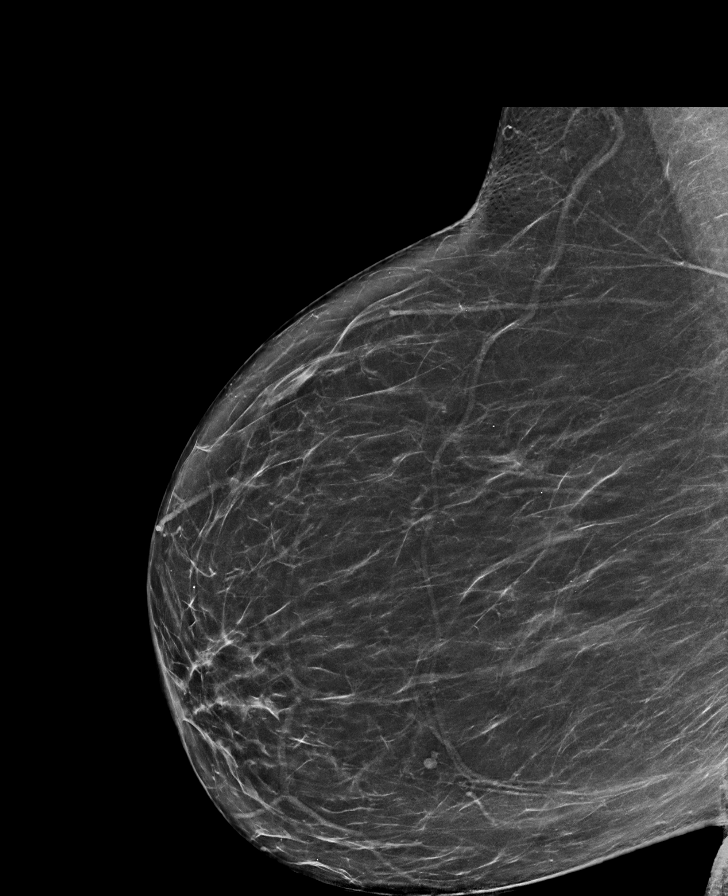

[L CC synth-2D]
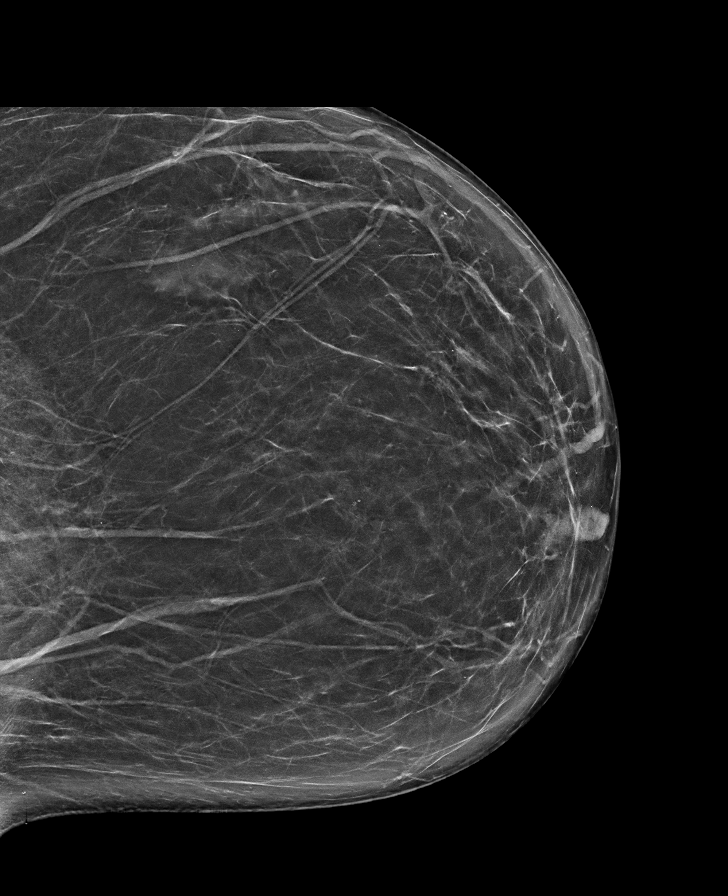

[R MLO synth-2D (2 of 2)]
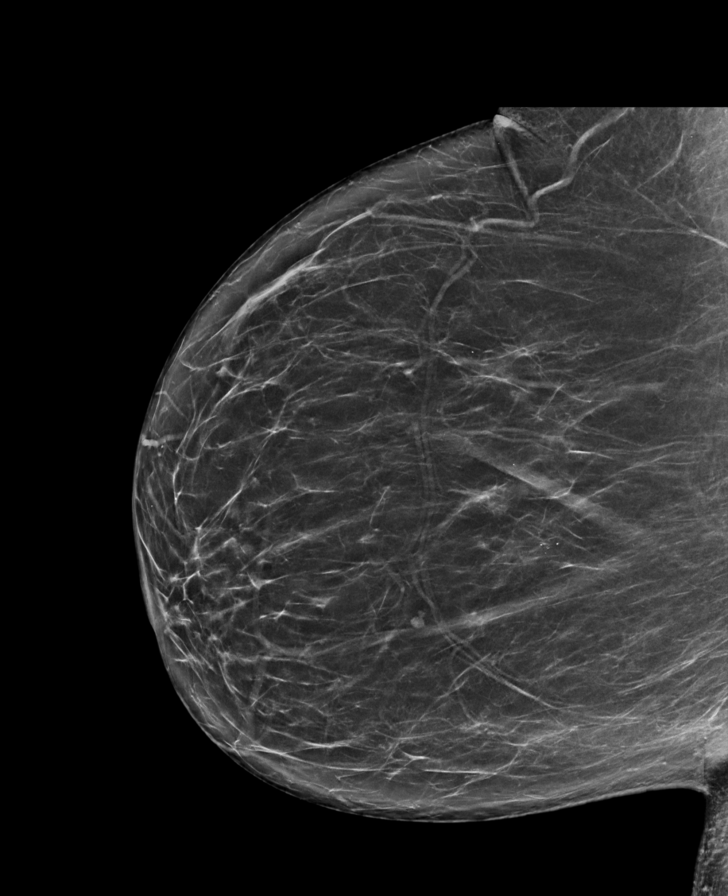

[R MLO tomo · tomo slice 41/80.0]
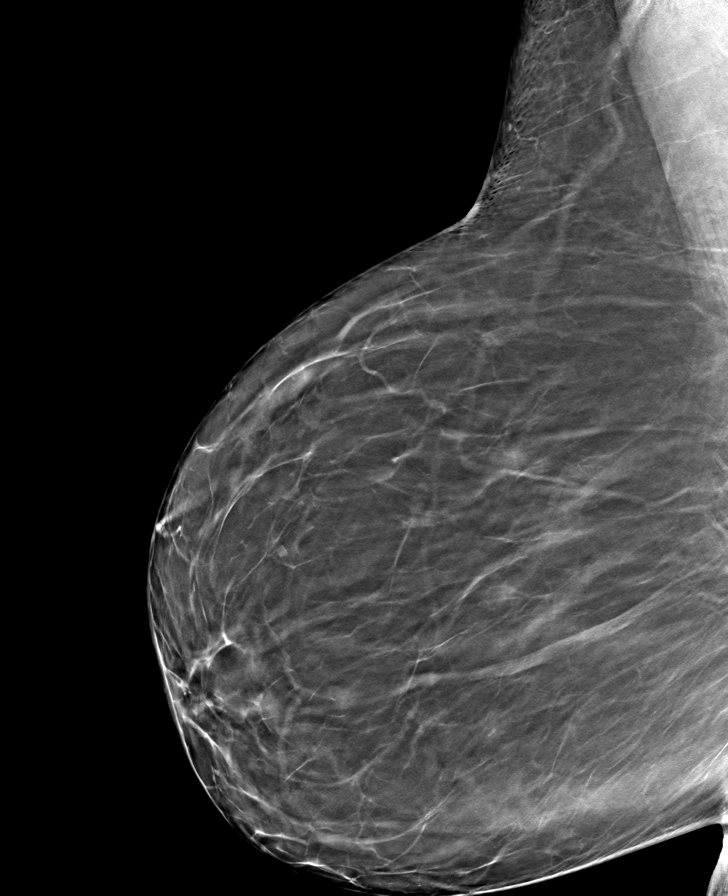

[8 of 40 positions shown; findings below may reference images not displayed]

FINDINGS: There are no findings suspicious for malignancy.
IMPRESSION: No mammographic evidence of malignancy. A result letter of this
screening mammogram will be mailed directly to the patient.

RECOMMENDATION:
Screening mammogram in one year. (Code:0E-3-N98)

BI-RADS CATEGORY  1: Negative.

## 2023-07-16 ENCOUNTER — Ambulatory Visit: Payer: Medicare HMO | Admitting: Diagnostic Neuroimaging

## 2023-08-12 ENCOUNTER — Encounter (HOSPITAL_BASED_OUTPATIENT_CLINIC_OR_DEPARTMENT_OTHER): Payer: Self-pay | Admitting: Obstetrics & Gynecology

## 2023-09-15 ENCOUNTER — Ambulatory Visit (INDEPENDENT_AMBULATORY_CARE_PROVIDER_SITE_OTHER): Payer: Medicare HMO | Admitting: Neurology

## 2023-09-15 ENCOUNTER — Ambulatory Visit: Payer: Self-pay | Admitting: Neurology

## 2023-09-15 DIAGNOSIS — R202 Paresthesia of skin: Secondary | ICD-10-CM

## 2023-09-15 DIAGNOSIS — R200 Anesthesia of skin: Secondary | ICD-10-CM | POA: Diagnosis not present

## 2023-09-15 DIAGNOSIS — Z0289 Encounter for other administrative examinations: Secondary | ICD-10-CM

## 2023-09-15 DIAGNOSIS — K146 Glossodynia: Secondary | ICD-10-CM

## 2023-09-15 DIAGNOSIS — G5602 Carpal tunnel syndrome, left upper limb: Secondary | ICD-10-CM

## 2023-09-15 NOTE — Procedures (Signed)
Full Name: Evelyn Bright Gender: Female MRN #: [id number] Date of Birth: 1956/04/13    Visit Date: 09/15/2023 12:40 Age: 67 Years Examining Physician: Dr. Levert Feinstein Referring Physician: Dr. Despina Arias Height: 5 feet 2 inch History: 67 year old female presenting with bilateral body paresthesia from head to toe since her kidney stone procedure in April 2024  Summary of the test:  Nerve conduction study:  Bilateral sural, superficial peroneal sensory responses were normal.  Bilateral tibial, peroneal to EDB motor responses were normal.  Bilateral median, ulnar sensory and motor responses were normal  Electromyography:  Selected needle examination of left upper and lower extremity muscles, left cervical and lumbosacral paraspinal muscles were normal.  Conclusion: This is a normal study.  There is no electrodiagnostic evidence of large fiber peripheral neuropathy; left cervical radiculopathy or left lumbosacral radiculopathy.    ------------------------------- Levert Feinstein, M.D. PhD  [facility] [address], [address] [place], [place] [address] Tel: [phone] Fax: [phone]  Verbal informed consent was obtained from the patient, patient was informed of potential risk of procedure, including bruising, bleeding, hematoma formation, infection, muscle weakness, muscle pain, numbness, among others.        MNC    Nerve / Sites Muscle Latency Ref. Amplitude Ref. Rel Amp Segments Distance Velocity Ref. Area    ms ms mV mV %  cm m/s m/s mVms  L Median - APB     Wrist APB 3.1 <=4.4 9.0 >=4.0 100 Wrist - APB 7   41.8     Upper arm APB 6.6  8.5  94.4 Upper arm - Wrist 19 55 >=49 38.6  R Median - APB     Wrist APB 3.3 <=4.4 9.7 >=4.0 100 Wrist - APB 7   40.2     Upper arm APB 6.8  9.7  100 Upper arm - Wrist 19 55 >=49 39.2  L Ulnar - ADM     Wrist ADM 2.7 <=3.3 6.9 >=6.0 100 Wrist - ADM 7   27.0     B.Elbow ADM 3.9  7.0  102 B.Elbow - Wrist 10 80 >=49  26.6     A.Elbow ADM 6.5  7.2  104 A.Elbow - B.Elbow 16 61 >=49 26.4  R Ulnar - ADM     Wrist ADM 3.1 <=3.3 6.1 >=6.0 100 Wrist - ADM 7   25.9     B.Elbow ADM 4.3  6.2  102 B.Elbow - Wrist 9.6 87 >=49 23.4     A.Elbow ADM 6.9  5.8  94.2 A.Elbow - B.Elbow 14 53 >=49 22.8  L Peroneal - EDB     Ankle EDB 4.2 <=6.5 5.3 >=2.0 100 Ankle - EDB 9   21.6     Fib head EDB 9.7  5.0  94.1 Fib head - Ankle 27 49 >=44 21.1     Pop fossa EDB 11.8  4.2  85.3 Pop fossa - Fib head 10 48 >=44 18.4         Pop fossa - Ankle      R Peroneal - EDB     Ankle EDB 4.3 <=6.5 5.1 >=2.0 100 Ankle - EDB 9   20.9     Fib head EDB 9.6  4.9  97.6 Fib head - Ankle 26 49 >=44 21.0     Pop fossa EDB 11.5  6.2  125 Pop fossa - Fib head 11 57 >=44 27.1         Pop fossa -  Ankle      L Tibial - AH     Ankle AH 4.0 <=5.8 8.9 >=4.0 100 Ankle - AH 9   23.9     Pop fossa AH 11.0  11.4  128 Pop fossa - Ankle 37 53 >=41 32.3  R Tibial - AH     Ankle AH 5.4 <=5.8 12.9 >=4.0 100 Ankle - AH 9   28.0     Pop fossa AH 11.0  9.7  75.1 Pop fossa - Ankle 34 61 >=41 28.8                     SNC    Nerve / Sites Rec. Site Peak Lat Ref.  Amp Ref. Segments Distance Peak Diff Ref.    ms ms V V  cm ms ms  L Sural - Ankle (Calf)     Calf Ankle 3.9 <=4.4 13 >=6 Calf - Ankle 14    R Sural - Ankle (Calf)     Calf Ankle 3.8 <=4.4 12 >=6 Calf - Ankle 14    L Superficial peroneal - Ankle     Lat leg Ankle 3.8 <=4.4 9 >=6 Lat leg - Ankle 14    R Superficial peroneal - Ankle     Lat leg Ankle 3.6 <=4.4 13 >=6 Lat leg - Ankle 14    L Median, Ulnar - Transcarpal comparison     Median Palm Wrist 2.0 <=2.2 69 >=35 Median Palm - Wrist 8       Ulnar Palm Wrist 1.9 <=2.2 32 >=12 Ulnar Palm - Wrist 8          Median Palm - Ulnar Palm  0.1 <=0.4  L Median - Orthodromic (Dig II, Mid palm)     Dig II Wrist 3.1 <=3.4 24 >=10 Dig II - Wrist 13    R Median - Orthodromic (Dig II, Mid palm)     Dig II Wrist 3.2 <=3.4 13 >=10 Dig II - Wrist 13    L Ulnar  - Orthodromic, (Dig V, Mid palm)     Dig V Wrist 2.9 <=3.1 10 >=5 Dig V - Wrist 11    R Ulnar - Orthodromic, (Dig V, Mid palm)     Dig V Wrist 3.0 <=3.1 13 >=5 Dig V - Wrist 49                         F  Wave    Nerve F Lat Ref.   ms ms  L Ulnar - ADM 26.5 <=32.0  R Ulnar - ADM 25.2 <=32.0  R Tibial - AH 48.6 <=56.0           EMG Summary Table    Spontaneous MUAP Recruitment  Muscle IA Fib PSW Fasc Other Amp Dur. Poly Pattern  L. Tibialis anterior Normal None None None _______ Normal Normal Normal Normal  L. Peroneus longus Normal None None None _______ Normal Normal Normal Normal  L. Gastrocnemius (Medial head) Normal None None None _______ Normal Normal Normal Normal  L. Vastus lateralis Normal None None None _______ Normal Normal Normal Normal  L. Tibialis posterior Normal None None None _______ Normal Normal Normal Normal  L. Lumbar paraspinals (low) Normal None None None _______ Normal Normal Normal Normal  L. Lumbar paraspinals (mid) Normal None None None _______ Normal Normal Normal Normal  L. First dorsal interosseous Normal None None None _______ Normal Normal Normal Normal  L. Pronator teres  Normal None None None _______ Normal Normal Normal Normal  L. Biceps brachii Normal None None None _______ Normal Normal Normal Normal  L. Deltoid Normal None None None _______ Normal Normal Normal Normal  L. Triceps brachii Normal None None None _______ Normal Normal Normal Normal  L. Extensor digitorum communis Normal None None None _______ Normal Normal Normal Normal  L. Cervical paraspinals Normal None None None _______ Normal Normal Normal Normal

## 2023-09-17 ENCOUNTER — Encounter (HOSPITAL_BASED_OUTPATIENT_CLINIC_OR_DEPARTMENT_OTHER): Payer: Self-pay | Admitting: Cardiovascular Disease

## 2023-09-17 ENCOUNTER — Ambulatory Visit (HOSPITAL_BASED_OUTPATIENT_CLINIC_OR_DEPARTMENT_OTHER): Payer: Medicare HMO | Admitting: Cardiovascular Disease

## 2023-09-17 VITALS — BP 108/70 | HR 82 | Ht 62.0 in | Wt 143.7 lb

## 2023-09-17 DIAGNOSIS — R002 Palpitations: Secondary | ICD-10-CM

## 2023-09-17 DIAGNOSIS — E7800 Pure hypercholesterolemia, unspecified: Secondary | ICD-10-CM | POA: Diagnosis not present

## 2023-09-17 DIAGNOSIS — I25118 Atherosclerotic heart disease of native coronary artery with other forms of angina pectoris: Secondary | ICD-10-CM | POA: Diagnosis not present

## 2023-09-17 DIAGNOSIS — Q248 Other specified congenital malformations of heart: Secondary | ICD-10-CM

## 2023-09-17 NOTE — Patient Instructions (Signed)
Medication Instructions:  Your physician recommends that you continue on your current medications as directed. Please refer to the Current Medication list given to you today.   *If you need a refill on your cardiac medications before your next appointment, please call your pharmacy*  Lab Work: NONE  Testing/Procedures: Your physician has requested that you have an echocardiogram. Echocardiography is a painless test that uses sound waves to create images of your heart. It provides your doctor with information about the size and shape of your heart and how well your heart's chambers and valves are working. This procedure takes approximately one hour. There are no restrictions for this procedure. Please do NOT wear cologne, perfume, aftershave, or lotions (deodorant is allowed). Please arrive 15 minutes prior to your appointment time.  Please note: We ask at that you not bring children with you during ultrasound (echo/ vascular) testing. Due to room size and safety concerns, children are not allowed in the ultrasound rooms during exams. Our front office staff cannot provide observation of children in our lobby area while testing is being conducted. An adult accompanying a patient to their appointment will only be allowed in the ultrasound room at the discretion of the ultrasound technician under special circumstances. We apologize for any inconvenience.   Follow-Up: At [facility], you and your health needs are our priority.  As part of our continuing mission to provide you with exceptional heart care, we have created designated Provider Care Teams.  These Care Teams include your primary Cardiologist (physician) and Advanced Practice Providers (APPs -  Physician Assistants and Nurse Practitioners) who all work together to provide you with the care you need, when you need it.  We recommend signing up for the patient portal called "MyChart".  Sign up information is provided on this After Visit  Summary.  MyChart is used to connect with patients for Virtual Visits (Telemedicine).  Patients are able to view lab/test results, encounter notes, upcoming appointments, etc.  Non-urgent messages can be sent to your provider as well.   To learn more about what you can do with MyChart, go to [email or web address].    Your next appointment:   6 month(s)  Provider:   Chilton Si, MD or Gillian Shields, NP

## 2023-09-17 NOTE — Progress Notes (Signed)
Cardiology Office Note:  .   Date:  10/17/2023  ID:  Evelyn Bright, DOB Dec 09, 1955, MRN [id number] PCP: Alyson Reedy, FNP  [facility] Providers Cardiologist:  Chilton Si, MD    History of Present Illness: Evelyn Bright is a 67 y.o. female with pericardial cyst, hyperlipidemia and palpitations who is here for follow up. She was first seen on 10/08/2020 for the evaluation of palpitations at the request of Alyson Reedy, FNP.      She developed palpations after having COVID. The episodes were happening sporadically so we did not get monitor. She did not get any medication. She had atypical chest pain. We recommended getting a calcium score and she declined. She was hesitant about any medical interventions. She saw Evelyn Bright 06/2022. She noted chest pain again and we referred for a calcium score which was 19 and 65th percentile for age and gender. She had a nuclear stress that revealed LVEF 79% and no ischemia. Monitor showed rare PVC and PACs. She had an MRI of the chest which showed a pericardial cyst. She saw Dr. Dorris Fetch 09/2022 who offered observation aspiration +/- ablation and robotic resection. She wanted to continue monitoring for now.   At her appointment 03/2023 she continued to have palpitations but improved from prior.  Pericardial cyst was unchanged on MRI 02/2023.  Evelyn Bright complains of persistent symptoms of pins and needles sensation throughout the body, including the mouth and throat, and dryness of the eyes and mouth. These symptoms began following kidney stone surgery and have been constant, with varying intensity. The patient reports occasional chest pain and increased shortness of breath, particularly when the pins and needles sensation is more intense. She also reports frequent pain in the feet, the cause of which is currently unknown.  She has been evaluated by a neurologist and a rheumatologist. An MRI of the spine was unrevealing. Blood work  was suggestive of Sjogren's syndrome. However, her rheumatologist does not believe this is the cause of the patient's symptoms due to the whole-body distribution of the pins and needles sensation. A nerve conduction study was also performed and came back normal.  Evelyn Bright has been managing the symptoms with over-the-counter remedies such as honey and lemon cough drops, which seem to help with the sensation of shortness of breath. She reports no significant changes in diet or lifestyle that could explain the symptoms. The patient also notes an unexplained weight loss, despite no intentional changes in diet or exercise.  The patient's cyst has been monitored, and while it is large and rare, it does not seem to be causing any immediate issues. The patient is scheduled for a follow-up regarding the cyst next year. The patient expresses frustration with the lack of a definitive diagnosis and the ongoing discomfort and disruption caused by the symptoms.      ROS:  As per HPI  Studies Reviewed: .        Risk Assessment/Calculations:         Physical Exam:    VS:  BP 108/70   Pulse 82   Ht 5\' 2"  (1.575 m)   Wt 143 lb 11.2 oz (65.2 kg)   LMP 11/16/2012   SpO2 96%   BMI 26.28 kg/m  , BMI Body mass index is 26.28 kg/m. GENERAL:  Well appearing HEENT: Pupils equal round and reactive, fundi not visualized, oral mucosa unremarkable NECK:  No jugular venous distention, waveform within normal limits, carotid upstroke brisk and symmetric,  no bruits, no thyromegaly LUNGS:  Clear to auscultation bilaterally HEART:  RRR.  PMI not displaced or sustained,S1 and S2 within normal limits, no S3, no S4, no clicks, no rubs, no murmurs ABD:  Flat, positive bowel sounds normal in frequency in pitch, no bruits, no rebound, no guarding, no midline pulsatile mass, no hepatomegaly, no splenomegaly EXT:  2 plus pulses throughout, no edema, no cyanosis no clubbing SKIN:  No rashes no nodules NEURO:  Cranial nerves  II through XII grossly intact, motor grossly intact throughout PSYCH:  Cognitively intact, oriented to person place and time   ASSESSMENT AND PLAN: .    # Possible Sjogren's Syndrome Persistent whole body pins and needles sensation, dry eyes, and dry mouth. Blood work showed elevated markers for Sjogren's syndrome. Neurologist and rheumatologist consultations have been inconclusive. Nerve conduction study results were normal. -Consider seeking a second opinion from a rheumatologist at an academic center. -Continue follow-up with current rheumatologist.  # Unexplained Weight Loss Significant weight loss without intentional dieting or increased exercise. -Monitor weight and report any further significant changes.  # Chest Pain Occasional chest pain, not worsened by deep breaths. -Order an echocardiogram to assess heart function and rule out any impact from the known cyst.  # Pericardial Cyst Large, rare cyst identified previously. No current symptoms directly attributed to the cyst. -Continue annual follow-up with Dr. Dorris Fetch  Follow-up in 6 months or sooner if needed.        Dispo: f/u 6 months  Signed, Chilton Si, MD

## 2023-09-29 DIAGNOSIS — H04129 Dry eye syndrome of unspecified lacrimal gland: Secondary | ICD-10-CM | POA: Diagnosis not present

## 2023-09-29 DIAGNOSIS — E663 Overweight: Secondary | ICD-10-CM | POA: Diagnosis not present

## 2023-09-29 DIAGNOSIS — R202 Paresthesia of skin: Secondary | ICD-10-CM | POA: Diagnosis not present

## 2023-09-29 DIAGNOSIS — Z6826 Body mass index (BMI) 26.0-26.9, adult: Secondary | ICD-10-CM | POA: Diagnosis not present

## 2023-09-29 DIAGNOSIS — R768 Other specified abnormal immunological findings in serum: Secondary | ICD-10-CM | POA: Diagnosis not present

## 2023-10-01 ENCOUNTER — Other Ambulatory Visit (HOSPITAL_BASED_OUTPATIENT_CLINIC_OR_DEPARTMENT_OTHER): Payer: Self-pay | Admitting: Family Medicine

## 2023-10-01 DIAGNOSIS — Z1231 Encounter for screening mammogram for malignant neoplasm of breast: Secondary | ICD-10-CM

## 2023-10-04 ENCOUNTER — Telehealth (HOSPITAL_BASED_OUTPATIENT_CLINIC_OR_DEPARTMENT_OTHER): Payer: Self-pay | Admitting: *Deleted

## 2023-10-04 ENCOUNTER — Ambulatory Visit (INDEPENDENT_AMBULATORY_CARE_PROVIDER_SITE_OTHER): Payer: Medicare HMO

## 2023-10-04 DIAGNOSIS — I3139 Other pericardial effusion (noninflammatory): Secondary | ICD-10-CM | POA: Diagnosis not present

## 2023-10-04 DIAGNOSIS — Q248 Other specified congenital malformations of heart: Secondary | ICD-10-CM | POA: Diagnosis not present

## 2023-10-04 LAB — ECHOCARDIOGRAM COMPLETE
Area-P 1/2: 3.93 cm2
S' Lateral: 3.17 cm

## 2023-10-04 NOTE — Telephone Encounter (Signed)
Evelyn Bright- This patient wanted to verify with you and Dr.[place]oa would it be okay to increase her B12? She was instructed by another provider( Rheum?) to increase but check with cardiologist first? I told her I would pass the message along and someone would reach out through phone or mychart.   Above message received via secure chat  Will forward to Pharm D to make sure ok to increase

## 2023-10-04 NOTE — Telephone Encounter (Signed)
October 04, 2023 Evelyn Bright, [place]  to Me      10/04/23  3:02 PM I do not see any problem with her taking increased dose Vit B12  Mychart message to patient

## 2023-10-06 ENCOUNTER — Encounter (HOSPITAL_BASED_OUTPATIENT_CLINIC_OR_DEPARTMENT_OTHER): Payer: Self-pay | Admitting: Radiology

## 2023-10-06 ENCOUNTER — Ambulatory Visit (HOSPITAL_BASED_OUTPATIENT_CLINIC_OR_DEPARTMENT_OTHER)
Admission: RE | Admit: 2023-10-06 | Discharge: 2023-10-06 | Disposition: A | Discharge status: Home / Self Care | Payer: Medicare HMO | Source: Ambulatory Visit | Attending: Family Medicine | Admitting: Family Medicine

## 2023-10-06 DIAGNOSIS — H04123 Dry eye syndrome of bilateral lacrimal glands: Secondary | ICD-10-CM | POA: Diagnosis not present

## 2023-10-06 DIAGNOSIS — H2513 Age-related nuclear cataract, bilateral: Secondary | ICD-10-CM | POA: Diagnosis not present

## 2023-10-06 DIAGNOSIS — H43393 Other vitreous opacities, bilateral: Secondary | ICD-10-CM | POA: Diagnosis not present

## 2023-10-06 DIAGNOSIS — H18512 Endothelial corneal dystrophy, left eye: Secondary | ICD-10-CM | POA: Diagnosis not present

## 2023-10-06 DIAGNOSIS — Z1231 Encounter for screening mammogram for malignant neoplasm of breast: Secondary | ICD-10-CM | POA: Diagnosis not present

## 2023-10-06 DIAGNOSIS — H524 Presbyopia: Secondary | ICD-10-CM | POA: Diagnosis not present

## 2023-10-06 NOTE — Telephone Encounter (Signed)
Patient read in [place]

## 2023-10-07 ENCOUNTER — Encounter (HOSPITAL_BASED_OUTPATIENT_CLINIC_OR_DEPARTMENT_OTHER): Payer: Self-pay | Admitting: Family Medicine

## 2023-10-17 ENCOUNTER — Encounter (HOSPITAL_BASED_OUTPATIENT_CLINIC_OR_DEPARTMENT_OTHER): Payer: Self-pay | Admitting: Cardiovascular Disease

## 2023-10-18 ENCOUNTER — Telehealth: Payer: Self-pay | Admitting: Cardiovascular Disease

## 2023-10-18 NOTE — Telephone Encounter (Signed)
Patient states she was returning call. Please advise  

## 2023-10-18 NOTE — Telephone Encounter (Signed)
Patient calling for results from her echo. Please advise

## 2023-10-18 NOTE — Telephone Encounter (Signed)
Have printed and placed with Dr Leonides Sake papers for review

## 2023-10-19 NOTE — Telephone Encounter (Signed)
October 19, 2023 Chilton Si, MD  to Me     10/19/23  1:48 PM Echo show that her heart is squeezing normally and the pericardial cyst is unchanged.  Stable from prior.  TCR  Advised patient, verbalized understanding

## 2023-11-24 ENCOUNTER — Ambulatory Visit: Payer: Medicare HMO | Admitting: Dermatology

## 2023-12-15 ENCOUNTER — Ambulatory Visit: Payer: Medicare HMO | Admitting: Dermatology

## 2023-12-15 ENCOUNTER — Encounter: Payer: Self-pay | Admitting: Dermatology

## 2023-12-15 VITALS — BP 130/80 | HR 99

## 2023-12-15 DIAGNOSIS — D225 Melanocytic nevi of trunk: Secondary | ICD-10-CM

## 2023-12-15 DIAGNOSIS — D492 Neoplasm of unspecified behavior of bone, soft tissue, and skin: Secondary | ICD-10-CM | POA: Diagnosis not present

## 2023-12-15 DIAGNOSIS — W908XXA Exposure to other nonionizing radiation, initial encounter: Secondary | ICD-10-CM

## 2023-12-15 DIAGNOSIS — D2239 Melanocytic nevi of other parts of face: Secondary | ICD-10-CM | POA: Diagnosis not present

## 2023-12-15 DIAGNOSIS — D1801 Hemangioma of skin and subcutaneous tissue: Secondary | ICD-10-CM

## 2023-12-15 DIAGNOSIS — L821 Other seborrheic keratosis: Secondary | ICD-10-CM | POA: Diagnosis not present

## 2023-12-15 DIAGNOSIS — L578 Other skin changes due to chronic exposure to nonionizing radiation: Secondary | ICD-10-CM

## 2023-12-15 DIAGNOSIS — D229 Melanocytic nevi, unspecified: Secondary | ICD-10-CM

## 2023-12-15 DIAGNOSIS — Z1283 Encounter for screening for malignant neoplasm of skin: Secondary | ICD-10-CM

## 2023-12-15 DIAGNOSIS — L814 Other melanin hyperpigmentation: Secondary | ICD-10-CM

## 2023-12-15 DIAGNOSIS — D485 Neoplasm of uncertain behavior of skin: Secondary | ICD-10-CM

## 2023-12-15 DIAGNOSIS — L729 Follicular cyst of the skin and subcutaneous tissue, unspecified: Secondary | ICD-10-CM

## 2023-12-15 DIAGNOSIS — H04129 Dry eye syndrome of unspecified lacrimal gland: Secondary | ICD-10-CM | POA: Insufficient documentation

## 2023-12-15 DIAGNOSIS — L720 Epidermal cyst: Secondary | ICD-10-CM | POA: Diagnosis not present

## 2023-12-15 NOTE — Progress Notes (Unsigned)
New Patient Visit   Subjective  Evelyn Bright is a 67 y.o. female who presents for the following: Total Body Skin Exam (TBSE)  Patient present today for new patient visit for TBSE.The patient reports she has spots, moles and lesions to be evaluated, some may be new or changing and the patient may have concern these could be cancer. Patient has not previously been treated by a dermatologist. Patient reports she does not have hx of bx. Patient denies family history of skin cancers. Patient reports throughout her lifetime has had  severe  sun exposure. Currently, patient reports if she has excessive sun exposure, she does apply sunscreen and/or wears protective coverings.  The following portions of the chart were reviewed this encounter and updated as appropriate: medications, allergies, medical history  Review of Systems:  No other skin or systemic complaints except as noted in HPI or Assessment and Plan.  Objective  Well appearing patient in no apparent distress; mood and affect are within normal limits.  A full examination was performed including scalp, head, eyes, ears, nose, lips, neck, chest, axillae, abdomen, back, buttocks, bilateral upper extremities, bilateral lower extremities, hands, feet, fingers, toes, fingernails, and toenails. All findings within normal limits unless otherwise noted below.   Relevant exam findings are noted in the Assessment and Plan.  Right Nasal Sidewall 5 mm pink papule Right Upper Back 1 cm pink papule  Assessment & Plan   LENTIGINES, SEBORRHEIC KERATOSES, HEMANGIOMAS - Benign normal skin lesions - Benign-appearing - Call for any changes  MELANOCYTIC NEVI - Tan-brown and/or pink-flesh-colored symmetric macules and papules - Benign appearing on exam today - Observation - Call clinic for new or changing moles - Recommend daily use of broad spectrum spf 30+ sunscreen to sun-exposed areas.   ACTINIC DAMAGE - Chronic condition, secondary to  cumulative UV/sun exposure - diffuse scaly erythematous macules with underlying dyspigmentation - Recommend daily broad spectrum sunscreen SPF 30+ to sun-exposed areas, reapply every 2 hours as needed.  - Staying in the shade or wearing long sleeves, sun glasses (UVA+UVB protection) and wide brim hats (4-inch brim around the entire circumference of the hat) are also recommended for sun protection.  - Call for new or changing lesions.  EPIDERMAL INCLUSION CYST Exam:  7 mm Subcutaneous nodule at Medial Chest  - Benign-appearing - Exam most consistent with an epidermal inclusion cyst - Discussed that a cyst is a benign growth that can grow over time and sometimes get irritated or inflamed. - We Will plan to schedule Excision with Dr. Caralyn Guile  SKIN CANCER SCREENING PERFORMED TODAY NEOPLASM OF UNCERTAIN BEHAVIOR OF SKIN (2) Right Nasal Sidewall Skin / nail biopsy Type of biopsy: tangential   Informed consent: discussed and consent obtained   Timeout: patient name, date of birth, surgical site, and procedure verified   Procedure prep:  Patient was prepped and draped in usual sterile fashion Prep type:  Isopropyl alcohol Anesthesia: the lesion was anesthetized in a standard fashion   Anesthetic:  1% lidocaine w/ epinephrine 1-100,000 buffered w/ 8.4% NaHCO3 Instrument used: DermaBlade   Hemostasis achieved with: aluminum chloride   Outcome: patient tolerated procedure well   Post-procedure details: sterile dressing applied and wound care instructions given   Dressing type: petrolatum gauze and bandage   Specimen A - Surgical pathology Differential Diagnosis: BCC vs SCC  Check Margins: No Right Upper Back Skin / nail biopsy Type of biopsy: tangential   Informed consent: discussed and consent obtained   Timeout: patient name,  date of birth, surgical site, and procedure verified   Procedure prep:  Patient was prepped and draped in usual sterile fashion Prep type:  Isopropyl  alcohol Anesthesia: the lesion was anesthetized in a standard fashion   Anesthetic:  1% lidocaine w/ epinephrine 1-100,000 buffered w/ 8.4% NaHCO3 Instrument used: DermaBlade   Hemostasis achieved with: aluminum chloride   Outcome: patient tolerated procedure well   Post-procedure details: sterile dressing applied and wound care instructions given   Dressing type: petrolatum gauze and bandage   Specimen B - Surgical pathology Differential Diagnosis: BCC vs SCC  Check Margins: No  Return in about 1 month (around 01/14/2024) for Cyst Excison with Dr. Caralyn Guile.  Documentation: I have reviewed the above documentation for accuracy and completeness, and I agree with the above.  Stasia Cavalier, am acting as scribe for Langston Reusing, DO.   Langston Reusing, DO

## 2023-12-15 NOTE — Patient Instructions (Addendum)

## 2023-12-18 HISTORY — PX: CYSTECTOMY: SUR359

## 2023-12-20 LAB — SURGICAL PATHOLOGY

## 2023-12-21 ENCOUNTER — Encounter: Payer: Self-pay | Admitting: Dermatology

## 2023-12-28 ENCOUNTER — Telehealth (HOSPITAL_BASED_OUTPATIENT_CLINIC_OR_DEPARTMENT_OTHER): Payer: Self-pay | Admitting: *Deleted

## 2023-12-28 NOTE — Telephone Encounter (Signed)
Spoke with patient who found out the dose of Vitamin B 12 Rheumatologist is recommending is 5,000 units daily   Will forward to Pharm D for reviewing this dose

## 2024-01-12 ENCOUNTER — Encounter: Payer: Self-pay | Admitting: Dermatology

## 2024-01-13 ENCOUNTER — Encounter: Payer: Self-pay | Admitting: Dermatology

## 2024-01-13 ENCOUNTER — Ambulatory Visit: Payer: Medicare HMO | Admitting: Dermatology

## 2024-01-13 ENCOUNTER — Other Ambulatory Visit: Payer: Self-pay | Admitting: Dermatology

## 2024-01-13 VITALS — BP 112/70 | HR 92 | Temp 97.8°F

## 2024-01-13 DIAGNOSIS — L570 Actinic keratosis: Secondary | ICD-10-CM

## 2024-01-13 DIAGNOSIS — D492 Neoplasm of unspecified behavior of bone, soft tissue, and skin: Secondary | ICD-10-CM | POA: Diagnosis not present

## 2024-01-13 DIAGNOSIS — D485 Neoplasm of uncertain behavior of skin: Secondary | ICD-10-CM

## 2024-01-13 DIAGNOSIS — L720 Epidermal cyst: Secondary | ICD-10-CM | POA: Diagnosis not present

## 2024-01-13 NOTE — Patient Instructions (Signed)

## 2024-01-13 NOTE — Progress Notes (Unsigned)
   Follow-Up Visit   Subjective  Evelyn Bright is a 68 y.o. female who presents for the following: Excision of medial chest  The following portions of the chart were reviewed this encounter and updated as appropriate: medications, allergies, medical history  Review of Systems:  No other skin or systemic complaints except as noted in HPI or Assessment and Plan.  Objective  Well appearing patient in no apparent distress; mood and affect are within normal limits.  A focused examination was performed of the following areas: Medial chest Relevant physical exam findings are noted in the Assessment and Plan.     Assessment & Plan   NEOPLASM OF UNCERTAIN BEHAVIOR OF SKIN Chest - Medial (Center) Skin excision  Excision method:  elliptical Lesion length (cm):  1 Lesion width (cm):  1.2 Margin per side (cm):  0.1 Total excision diameter (cm):  1.4 Informed consent: discussed and consent obtained   Timeout: patient name, date of birth, surgical site, and procedure verified   Procedure prep:  Patient was prepped and draped in usual sterile fashion Prep type:  Chlorhexidine Anesthesia: the lesion was anesthetized in a standard fashion   Anesthetic:  1% lidocaine w/ epinephrine 1-100,000 buffered w/ 8.4% NaHCO3 Instrument used: #15 blade   Hemostasis achieved with: suture, pressure and electrodesiccation   Outcome: patient tolerated procedure well with no complications   Post-procedure details: sterile dressing applied and wound care instructions given   Dressing type: bandage and pressure dressing    Skin repair Complexity:  Complex Final length (cm):  2.7 Informed consent: discussed and consent obtained   Timeout: patient name, date of birth, surgical site, and procedure verified   Procedure prep:  Patient was prepped and draped in usual sterile fashion Prep type:  Chlorhexidine Anesthesia: the lesion was anesthetized in a standard fashion   Anesthetic:  1% lidocaine w/  epinephrine 1-100,000 buffered w/ 8.4% NaHCO3 Reason for type of repair: reduce tension to allow closure and preserve normal anatomy   Undermining: edges undermined   Subcutaneous layers (deep stitches):  Suture size:  5-0 Suture type: Monocryl (poliglecaprone 25)   Stitches:  Buried vertical mattress Fine/surface layer approximation (top stitches):  Suture type: cyanoacrylate tissue glue   Hemostasis achieved with: suture, pressure and electrodesiccation Outcome: patient tolerated procedure well with no complications   Post-procedure details: sterile dressing applied and wound care instructions given   Dressing type: bandage and pressure dressing     No follow-ups on file.  ***  Documentation: I have reviewed the above documentation for accuracy and completeness, and I agree with the above.  Gwenith Daily, MD

## 2024-01-14 LAB — DERMATOLOGY PATHOLOGY

## 2024-01-17 ENCOUNTER — Encounter (HOSPITAL_BASED_OUTPATIENT_CLINIC_OR_DEPARTMENT_OTHER): Payer: Self-pay | Admitting: Obstetrics & Gynecology

## 2024-01-17 ENCOUNTER — Ambulatory Visit (INDEPENDENT_AMBULATORY_CARE_PROVIDER_SITE_OTHER): Payer: Medicare HMO | Admitting: Obstetrics & Gynecology

## 2024-01-17 VITALS — BP 134/89 | HR 106 | Ht 61.0 in | Wt 149.0 lb

## 2024-01-17 DIAGNOSIS — N952 Postmenopausal atrophic vaginitis: Secondary | ICD-10-CM

## 2024-01-17 DIAGNOSIS — Z01419 Encounter for gynecological examination (general) (routine) without abnormal findings: Secondary | ICD-10-CM | POA: Diagnosis not present

## 2024-01-17 DIAGNOSIS — Z1211 Encounter for screening for malignant neoplasm of colon: Secondary | ICD-10-CM

## 2024-01-17 DIAGNOSIS — N200 Calculus of kidney: Secondary | ICD-10-CM

## 2024-01-17 DIAGNOSIS — R300 Dysuria: Secondary | ICD-10-CM

## 2024-01-17 DIAGNOSIS — R3915 Urgency of urination: Secondary | ICD-10-CM

## 2024-01-17 LAB — POCT URINALYSIS DIPSTICK
Appearance: NORMAL
Bilirubin, UA: NEGATIVE
Blood, UA: NEGATIVE
Glucose, UA: NEGATIVE
Ketones, UA: NEGATIVE
Leukocytes, UA: NEGATIVE
Nitrite, UA: NEGATIVE
Protein, UA: NEGATIVE
Spec Grav, UA: 1.01 (ref 1.010–1.025)
Urobilinogen, UA: 0.2 U/dL
pH, UA: 7 (ref 5.0–8.0)

## 2024-01-17 MED ORDER — ESTRADIOL 0.1 MG/GM VA CREA: TOPICAL_CREAM | VAGINAL | 3 refills | Status: DC

## 2024-01-17 NOTE — Progress Notes (Signed)
 Breast and Pelvic Patient name: Evelyn Bright MRN [id number]  Date of birth: July 11, 1956 Chief Complaint:   Breast and Pelvic exam  History of Present Illness:   Evelyn Bright is a 68 y.o. G2P2 Caucasian female her for breast and pelvic exam.  Denies vaginal bleeding.  Does want urine checked today.  She is having a little burning today.  POCT testing negative today.    Patient's last menstrual period was 11/16/2012.  Last pap 09/28/22. Results were: NILM w/ HRHPV negative. H/O abnormal pap: no Last mammogram: 10/06/23. Results were: normal. Family h/o breast cancer: no Last colonoscopy: declined; Cologuard-08/22/19 negative. Results were: normal. Family h/o colorectal cancer: no Bone Density: 03/24/21-osteopenia     01/17/2024    1:50 PM 06/25/2023   11:30 AM 05/03/2023   10:59 AM 01/27/2023    3:06 PM 09/28/2022    8:45 AM  Depression screen PHQ 2/9  Decreased Interest 0 0 0 0 0  Down, Depressed, Hopeless 0 0 0 0 0  PHQ - 2 Score 0 0 0 0 0  Altered sleeping  0 1    Tired, decreased energy  0 2    Change in appetite  0 0    Feeling bad or failure about yourself   0 0    Trouble concentrating  0 0    Moving slowly or fidgety/restless  0 3    Suicidal thoughts  0 0    PHQ-9 Score  0 6    Difficult doing work/chores  Not difficult at all Not difficult at all      Review of Systems:   Pertinent items are noted in HPI  Denies any headaches, blurred vision, fatigue, shortness of breath, chest pain, abdominal pain, abnormal vaginal discharge/itching/odor/irritation, problems with periods, bowel movements, urination, or intercourse unless otherwise stated above.  Pertinent History Reviewed:  Reviewed past medical,surgical, social and family history.   Reviewed problem list, medications and allergies. Physical Assessment:   Vitals:   01/17/24 1346  BP: 134/89  Pulse: (!) 106  Weight: 149 lb (67.6 kg)  Height: 5\' 1"  (1.549 m)  Body mass index is 28.15 kg/m.        Physical  Examination:   General appearance - well appearing, and in no distress  Mental status - alert, oriented to person, place, and time  Psych:  She has a normal mood and affect  Skin - warm and dry, normal color, no suspicious lesions noted  Chest - effort normal, all lung fields clear to auscultation bilaterally  Heart - normal rate and regular rhythm  Neck:  midline trachea, no thyromegaly or nodules  Breasts - breasts appear normal, no suspicious masses, no skin or nipple changes or  axillary nodes  Abdomen - soft, nontender, nondistended, no masses or organomegaly  Pelvic - VULVA: normal appearing vulva with no masses, tenderness or lesions   VAGINA: normal appearing vagina with normal color and discharge, no lesions   CERVIX: normal appearing cervix without discharge or lesions, no CMT  Thin prep pap is not indicated  UTERUS: uterus is felt to be normal size, shape, consistency and nontender   ADNEXA: No adnexal masses or tenderness noted.  Rectal - normal rectal, good sphincter tone, no masses felt.  Extremities:  No swelling or varicosities noted  Raechel Ache, RN, chaperone present for exam  Assessment & Plan:  1. Encntr for gyn exam (general) (routine) w/o abn findings (Primary) - Pap smear 2023.  Not indicated today.  -  Mammogram 09/2023. - Colonoscopy declined.  Cologuard ordered. - Bone mineral density 09/2023 - lab work done 04/2023 - vaccines reviewed/updated  2. Vaginal atrophy - discussed using vaginal estradiol cream 1 gram pv twice weekly.  3. Dysuria - POCT urinalysis dipstick  4. Colon cancer screening - Cologuard  5. Urinary urgency  6. Nephrolithiasis - followed by urology   Orders Placed This Encounter  Procedures   Cologuard   POCT urinalysis dipstick    Meds:  Meds ordered this encounter  Medications   estradiol (ESTRACE) 0.1 MG/GM vaginal cream    Sig: 1 gram vaginally twice weekly    Dispense:  42.5 g    Refill:  3    Follow-up: Return in  about 1 year (around 01/16/2025).  Jerene Bears, MD 01/17/2024 2:42 PM

## 2024-02-16 DIAGNOSIS — Z1211 Encounter for screening for malignant neoplasm of colon: Secondary | ICD-10-CM | POA: Diagnosis not present

## 2024-02-21 ENCOUNTER — Other Ambulatory Visit (HOSPITAL_BASED_OUTPATIENT_CLINIC_OR_DEPARTMENT_OTHER): Payer: Self-pay | Admitting: Obstetrics & Gynecology

## 2024-02-21 DIAGNOSIS — Z0184 Encounter for antibody response examination: Secondary | ICD-10-CM

## 2024-02-22 ENCOUNTER — Other Ambulatory Visit (HOSPITAL_BASED_OUTPATIENT_CLINIC_OR_DEPARTMENT_OTHER): Payer: Self-pay

## 2024-02-22 DIAGNOSIS — Z0184 Encounter for antibody response examination: Secondary | ICD-10-CM

## 2024-02-23 ENCOUNTER — Encounter (HOSPITAL_BASED_OUTPATIENT_CLINIC_OR_DEPARTMENT_OTHER): Payer: Self-pay | Admitting: Obstetrics & Gynecology

## 2024-02-23 LAB — RUBEOLA ANTIBODY IGG: RUBEOLA AB, IGG: >300 [AU]/ml (ref >16.4)

## 2024-02-23 LAB — COLOGUARD: COLOGUARD: NEGATIVE

## 2024-02-25 ENCOUNTER — Encounter (HOSPITAL_BASED_OUTPATIENT_CLINIC_OR_DEPARTMENT_OTHER): Payer: Self-pay | Admitting: Obstetrics & Gynecology

## 2024-03-21 ENCOUNTER — Encounter: Payer: Self-pay | Admitting: Thoracic Surgery (Cardiothoracic Vascular Surgery)

## 2024-03-21 ENCOUNTER — Other Ambulatory Visit: Payer: Self-pay | Admitting: Thoracic Surgery (Cardiothoracic Vascular Surgery)

## 2024-03-21 DIAGNOSIS — Q248 Other specified congenital malformations of heart: Secondary | ICD-10-CM

## 2024-03-22 ENCOUNTER — Encounter: Payer: Self-pay | Admitting: Thoracic Surgery (Cardiothoracic Vascular Surgery)

## 2024-03-23 ENCOUNTER — Encounter: Payer: Self-pay | Admitting: Thoracic Surgery (Cardiothoracic Vascular Surgery)

## 2024-03-27 ENCOUNTER — Encounter: Payer: Self-pay | Admitting: Thoracic Surgery (Cardiothoracic Vascular Surgery)

## 2024-03-28 ENCOUNTER — Encounter: Payer: Self-pay | Admitting: Thoracic Surgery (Cardiothoracic Vascular Surgery)

## 2024-03-29 DIAGNOSIS — H04129 Dry eye syndrome of unspecified lacrimal gland: Secondary | ICD-10-CM | POA: Diagnosis not present

## 2024-03-29 DIAGNOSIS — R202 Paresthesia of skin: Secondary | ICD-10-CM | POA: Diagnosis not present

## 2024-03-29 DIAGNOSIS — E663 Overweight: Secondary | ICD-10-CM | POA: Diagnosis not present

## 2024-03-29 DIAGNOSIS — R768 Other specified abnormal immunological findings in serum: Secondary | ICD-10-CM | POA: Diagnosis not present

## 2024-03-29 DIAGNOSIS — Z6828 Body mass index (BMI) 28.0-28.9, adult: Secondary | ICD-10-CM | POA: Diagnosis not present

## 2024-03-29 DIAGNOSIS — M25649 Stiffness of unspecified hand, not elsewhere classified: Secondary | ICD-10-CM | POA: Diagnosis not present

## 2024-03-31 ENCOUNTER — Encounter: Payer: Self-pay | Admitting: Thoracic Surgery (Cardiothoracic Vascular Surgery)

## 2024-04-03 ENCOUNTER — Encounter: Payer: Self-pay | Admitting: Thoracic Surgery (Cardiothoracic Vascular Surgery)

## 2024-04-03 ENCOUNTER — Other Ambulatory Visit: Payer: Self-pay | Admitting: Thoracic Surgery (Cardiothoracic Vascular Surgery)

## 2024-04-03 DIAGNOSIS — Q248 Other specified congenital malformations of heart: Secondary | ICD-10-CM

## 2024-04-04 ENCOUNTER — Encounter: Payer: Self-pay | Admitting: Thoracic Surgery (Cardiothoracic Vascular Surgery)

## 2024-04-05 ENCOUNTER — Ambulatory Visit
Admission: RE | Admit: 2024-04-05 | Discharge: 2024-04-05 | Disposition: A | Discharge status: Home / Self Care | Source: Ambulatory Visit | Attending: Thoracic Surgery (Cardiothoracic Vascular Surgery) | Admitting: Thoracic Surgery (Cardiothoracic Vascular Surgery)

## 2024-04-05 ENCOUNTER — Other Ambulatory Visit

## 2024-04-05 DIAGNOSIS — Q248 Other specified congenital malformations of heart: Secondary | ICD-10-CM

## 2024-04-05 DIAGNOSIS — J9859 Other diseases of mediastinum, not elsewhere classified: Secondary | ICD-10-CM | POA: Diagnosis not present

## 2024-04-05 DIAGNOSIS — I318 Other specified diseases of pericardium: Secondary | ICD-10-CM | POA: Diagnosis not present

## 2024-04-05 MED ORDER — GADOPICLENOL 0.5 MMOL/ML IV SOLN
7.0000 mL | Freq: Once | INTRAVENOUS | Status: AC | PRN
  Administered 2024-04-05: 7 mL via INTRAVENOUS

## 2024-04-24 DIAGNOSIS — R102 Pelvic and perineal pain: Secondary | ICD-10-CM | POA: Diagnosis not present

## 2024-04-24 NOTE — Progress Notes (Unsigned)
 Cardiology Office Note:  .   Date:  04/24/2024  ID:  Evelyn Bright, DOB April 29, 1956, MRN [id number] PCP: de [place], Raymond J, MD  [facility] Providers Cardiologist:  Maudine Sos, MD { Click to update primary MD,subspecialty MD or APP then REFRESH:1}   History of Present Illness: .   Evelyn Bright is a 68 y.o. female with a pericardial cyst, hyperlipidemia and palpitations who is here for follow up. She was first seen on 10/08/2020 for the evaluation of palpitations.  She developed palpations after having COVID. The episodes were happening sporadically so we did not get monitor. She did not get any medication. She had atypical chest pain. We recommended getting a calcium score and she declined. She was hesitant about any medical interventions. She saw Neomi Banks 06/2022. She noted chest pain again and we referred for a calcium score which was 19 and 65th percentile for age and gender. She had a nuclear stress that revealed LVEF 79% and no ischemia. Monitor showed rare PVC and PACs. She had an MRI of the chest which showed a pericardial cyst. She saw Dr. Luna Salinas 09/2022 who offered observation aspiration +/- ablation and robotic resection. She wanted to continue monitoring for now.   At her appointment 03/2023 she continued to have palpitations but improved from prior.  Pericardial cyst was unchanged on MRI 02/2023. At her visit 09/2023 she had atypical symptoms and was being evaluated for Sjogren's syndrome.  She had occasional chest pain.  Echo revealed a small pericardial effusion and no hemodynamic compromise fro her pericardial cyst.   Discussed the use of AI scribe software for clinical note transcription with the patient, who gave verbal consent to proceed.  History of Present Illness     ROS:  As per HPI  Studies Reviewed: .       Echo 09/2023: IMPRESSIONS    1. Left ventricular ejection fraction, by estimation, is 55 to 60%. The  left ventricle has normal  function. The left ventricle has no regional  wall motion abnormalities. Left ventricular diastolic parameters were  normal.   2. Right ventricular systolic function is normal. The right ventricular  size is normal. Tricuspid regurgitation signal is inadequate for assessing  PA pressure.   3. A small pericardial effusion is present. The pericardial effusion is  posterior and lateral to the left ventricle.   4. The mitral valve is normal in structure. Trivial mitral valve  regurgitation. No evidence of mitral stenosis.   5. The aortic valve is tricuspid. Aortic valve regurgitation is not  visualized. No aortic stenosis is present.   6. The inferior vena cava is normal in size with greater than 50%  respiratory variability, suggesting right atrial pressure of 3 mmHg.   Risk Assessment/Calculations:   {Does this patient have ATRIAL FIBRILLATION?:[phone]} No BP recorded.  {Refresh Note OR Click here to enter BP  :1}***       Physical Exam:   VS:  LMP 11/16/2012  , BMI There is no height or weight on file to calculate BMI. GENERAL:  Well appearing HEENT: Pupils equal round and reactive, fundi not visualized, oral mucosa unremarkable NECK:  No jugular venous distention, waveform within normal limits, carotid upstroke brisk and symmetric, no bruits, no thyromegaly LUNGS:  Clear to auscultation bilaterally HEART:  RRR.  PMI not displaced or sustained,S1 and S2 within normal limits, no S3, no S4, no clicks, no rubs, *** murmurs ABD:  Flat, positive bowel sounds normal in frequency in pitch,  no bruits, no rebound, no guarding, no midline pulsatile mass, no hepatomegaly, no splenomegaly EXT:  2 plus pulses throughout, no edema, no cyanosis no clubbing SKIN:  No rashes no nodules NEURO:  Cranial nerves II through XII grossly intact, motor grossly intact throughout PSYCH:  Cognitively intact, oriented to person place and time   ASSESSMENT AND PLAN: .   *** Assessment and Plan Assessment &  Plan         {Are you ordering a CV Procedure (e.g. stress test, cath, DCCV, TEE, etc)?   Press F2        :[id number]}  Dispo: ***  Signed, Maudine Sos, MD

## 2024-04-25 ENCOUNTER — Ambulatory Visit (HOSPITAL_BASED_OUTPATIENT_CLINIC_OR_DEPARTMENT_OTHER): Payer: Medicare HMO | Admitting: Cardiovascular Disease

## 2024-04-25 ENCOUNTER — Other Ambulatory Visit (HOSPITAL_COMMUNITY): Payer: Self-pay | Admitting: Family Medicine

## 2024-04-25 ENCOUNTER — Encounter (HOSPITAL_BASED_OUTPATIENT_CLINIC_OR_DEPARTMENT_OTHER): Payer: Self-pay | Admitting: Cardiovascular Disease

## 2024-04-25 VITALS — BP 126/84 | HR 80 | Ht 63.0 in | Wt 154.9 lb

## 2024-04-25 DIAGNOSIS — Q248 Other specified congenital malformations of heart: Secondary | ICD-10-CM

## 2024-04-25 DIAGNOSIS — E782 Mixed hyperlipidemia: Secondary | ICD-10-CM

## 2024-04-25 DIAGNOSIS — I25118 Atherosclerotic heart disease of native coronary artery with other forms of angina pectoris: Secondary | ICD-10-CM

## 2024-04-25 DIAGNOSIS — R002 Palpitations: Secondary | ICD-10-CM

## 2024-04-25 NOTE — Patient Instructions (Signed)
 Medication Instructions:  Your physician recommends that you continue on your current medications as directed. Please refer to the Current Medication list given to you today.  *If you need a refill on your cardiac medications before your next appointment, please call your pharmacy*  Lab Work: LP/CMET TODAY   If you have labs (blood work) drawn today and your tests are completely normal, you will receive your results only by: MyChart Message (if you have MyChart) OR A paper copy in the mail If you have any lab test that is abnormal or we need to change your treatment, we will call you to review the results.  Testing/Procedures: NONE  Follow-Up: At [facility], you and your health needs are our priority.  As part of our continuing mission to provide you with exceptional heart care, our providers are all part of one team.  This team includes your primary Cardiologist (physician) and Advanced Practice Providers or APPs (Physician Assistants and Nurse Practitioners) who all work together to provide you with the care you need, when you need it.  Your next appointment:   12 month(s)  Provider:   Maudine Sos, MD, Slater Duncan, NP, or Neomi Banks, NP     We recommend signing up for the patient portal called "MyChart".  Sign up information is provided on this After Visit Summary.  MyChart is used to connect with patients for Virtual Visits (Telemedicine).  Patients are able to view lab/test results, encounter notes, upcoming appointments, etc.  Non-urgent messages can be sent to your provider as well.   To learn more about what you can do with MyChart, go to [email or web address].

## 2024-04-26 ENCOUNTER — Ambulatory Visit: Payer: Self-pay | Admitting: Cardiovascular Disease

## 2024-04-26 LAB — COMPREHENSIVE METABOLIC PANEL WITH GFR
ALT: 15 IU/L (ref 0–32)
AST: 16 IU/L (ref 0–40)
Albumin: 4.6 g/dL (ref 3.9–4.9)
Alkaline Phosphatase: 85 IU/L (ref 44–121)
BUN/Creatinine Ratio: 22 (ref 12–28)
BUN: 16 mg/dL (ref 8–27)
Bilirubin Total: 0.5 mg/dL (ref 0.0–1.2)
CO2: 22 mmol/L (ref 20–29)
Calcium: 9.7 mg/dL (ref 8.7–10.3)
Chloride: 105 mmol/L (ref 96–106)
Creatinine, Ser: 0.74 mg/dL (ref 0.57–1.00)
Globulin, Total: 2.6 g/dL (ref 1.5–4.5)
Glucose: 90 mg/dL (ref 70–99)
Potassium: 4.4 mmol/L (ref 3.5–5.2)
Sodium: 143 mmol/L (ref 134–144)
Total Protein: 7.2 g/dL (ref 6.0–8.5)
eGFR: 89 mL/min/{1.73_m2} (ref >59)

## 2024-04-26 LAB — LIPID PANEL
Chol/HDL Ratio: 2.5 ratio (ref 0.0–4.4)
Cholesterol, Total: 205 mg/dL — ABNORMAL HIGH (ref 100–199)
HDL: 83 mg/dL (ref >39)
LDL Chol Calc (NIH): 110 mg/dL — ABNORMAL HIGH (ref 0–99)
Triglycerides: 69 mg/dL (ref 0–149)
VLDL Cholesterol Cal: 12 mg/dL (ref 5–40)

## 2024-05-01 ENCOUNTER — Ambulatory Visit (HOSPITAL_BASED_OUTPATIENT_CLINIC_OR_DEPARTMENT_OTHER): Payer: Medicare HMO | Admitting: Cardiovascular Disease

## 2024-05-02 ENCOUNTER — Encounter: Payer: Self-pay | Admitting: Thoracic Surgery (Cardiothoracic Vascular Surgery)

## 2024-05-02 ENCOUNTER — Ambulatory Visit: Admitting: Thoracic Surgery (Cardiothoracic Vascular Surgery)

## 2024-05-02 ENCOUNTER — Ambulatory Visit
Attending: Thoracic Surgery (Cardiothoracic Vascular Surgery) | Admitting: Thoracic Surgery (Cardiothoracic Vascular Surgery)

## 2024-05-02 VITALS — BP 158/77 | HR 87 | Resp 20 | Ht 63.0 in | Wt 156.9 lb

## 2024-05-02 DIAGNOSIS — Q248 Other specified congenital malformations of heart: Secondary | ICD-10-CM | POA: Diagnosis not present

## 2024-05-02 NOTE — Progress Notes (Signed)
 [address], [place] [address]             [phone]     HPI: Evelyn Bright returns for follow-up of her mediastinal cyst.  Evelyn Bright is a 68 year old woman with a past history of hyperlipidemia, osteopenia, palpitations, mediastinal cyst, chest pain, and nephrolithiasis.  In 2023 she was being evaluated for chest pain.  She had a CT for coronary calcium scoring.  Calcium score was 19.  She was noted to have a middle mediastinal cyst.  MR imaging features were consistent with a benign pericardial cyst.  I saw her in June 2024 and the cyst was unchanged.  In the interim since her last visit she continues to intermittently have left-sided chest discomfort.  Currently primary issue is cramping and pain in her calves and feet.  She had a BMET which showed her electrolytes were normal.  Past Medical History:  Diagnosis Date   Arthritis    Atypical chest pain 11/08/2020   Frequency of urination    Osteopenia    Palpitations 11/08/2020   cardiologist-- dr Elva Hamburger. Theodis Fiscal, for palpitations/ atypical chest pain;  cardiac work-up results in epic,  nuclear stress test normal , echo normal, zio monitor normal   Pericardial cyst    evaluated by cardiacthoracic surgeon 09-29-2022 note in epic, likely pericardial cyst or thymic cyst, unclear if the that is what is causing pain but symptoms are improving , plan is to return 6 month and repeat MRI   Pure hypercholesterolemia 11/08/2020   cardiac CT 07-28-2022 w/ calcium score = 19 involving LAD   Renal cyst, left    Right ureteral calculus    Wears glasses     Current Outpatient Medications  Medication Sig Dispense Refill   Cholecalciferol (VITAMIN D3) 50 MCG (2000 UT) TABS Take 1 tablet by mouth daily.     No current facility-administered medications for this visit.    Physical Exam BP (!) 158/77 (BP Location: Left Arm, Patient Position: Sitting, Cuff Size: Normal)   Pulse 87   Resp 20   Ht 5' 3 (1.6 m)   Wt  156 lb 14.4 oz (71.2 kg)   LMP 11/16/2012   SpO2 98% Comment: RA  BMI 27.10 kg/m  68 year old woman in no acute distress Alert and oriented x 3 with no focal deficits Lungs clear bilaterally Cardiac regular rate and rhythm No peripheral edema  Diagnostic Tests: MR CHEST WITH AND WITHOUT CONTRAST   TECHNIQUE: Multiplanar, multisequence MR imaging of the chest was performed before and after the administration of intravenous contrast.   CONTRAST:  7 mL Vueway  IV   COMPARISON:  03/14/2023   FINDINGS: Nonenhancing curvilinear cystic lesion along the left heart border, measuring approximately 9.1 x 2.7 cm (series 10/image 48), with heterogeneous fluid density. This appearance remains compatible with a benign pericardial cyst.   Lungs are clear.   The heart is normal in size. No suspicious mediastinal lymphadenopathy.   Visualized upper abdomen is unremarkable.   Mild degenerative changes of the visualized thoracolumbar spine. Benign vertebral hemangiomas at T4, T10, and T12.   IMPRESSION: Stable benign left pericardial cyst, as above.     Electronically Signed   By: Zadie Herter M.D.   On: 04/15/2024 21:06 I personally reviewed the MRI images.  No significant change in size of cystic structure left side middle mediastinum.  Impression: Evelyn Bright is a 68 year old woman with a past history of hyperlipidemia, osteopenia, palpitations, mediastinal  cyst, chest pain, and nephrolithiasis.   Mediastinal cyst- MRI imaging characteristics consistent with a benign pericardial cyst.  Has not significantly increased in size.  Slightly unclear if this could have anything to do with her chest discomfort.  Is not impossible, but I doubt removal of the cyst would improve her symptoms.  Has been stable for 2 years.  Will plan another MR again next year to ensure stability for 3 years.  Blood pressure elevated today.  Was normal last week when she saw Dr. Theodis Fiscal.   Monitor  Plan: Return in 1 year with MR chest  I spent over 20 minutes in review of records, images, and consultation with Evelyn Bright today. Zelphia Higashi, MD [facility] [phone]

## 2024-05-03 ENCOUNTER — Telehealth: Payer: Self-pay | Admitting: Cardiovascular Disease

## 2024-05-03 MED ORDER — NITROGLYCERIN 0.4 MG SL SUBL: 0.4000 mg | SUBLINGUAL_TABLET | SUBLINGUAL | 3 refills | Status: AC | PRN

## 2024-05-03 NOTE — Telephone Encounter (Signed)
 Pt requesting cb to discuss lab results due to still having leg and feet pain/cramping

## 2024-05-03 NOTE — Telephone Encounter (Signed)
 Called and spoke to pt.  Pt is continuing to have leg cramps. They are not frequent but come every so often. Pt has not tried compression stockings - LPN advised pt to get compression stockings and see if that helps with her cramping. [facility] sent with preferred compression level. Informed pt to let office know when she would like to start Nexlizet; pt would like to do research before coming to conclusion. Also, advised pot to f/u with pcp to get a yearly follow up appointment and inform them of leg cramping.  Pt aware ans verbalized understanding.

## 2024-05-03 NOTE — Telephone Encounter (Signed)
*  STAT* If patient is at the pharmacy, call can be transferred to refill team.   1. Which medications need to be refilled? (please list name of each medication and dose if known) Nitro  2. Which pharmacy/location (including street and city if local pharmacy) is medication to be sent to? [id number] - [place], [place] - [address] [phone]   3. Do they need a 30 day or 90 day supply? 90 Not on current med list

## 2024-05-03 NOTE — Telephone Encounter (Signed)
 Pt calling requesting a refill on medication nitroglycerin . This medication was D/C off of pt's medication list. Pt would like for Dr. Theodis Fiscal to reorder this medication if possible. Would Dr. Theodis Fiscal like to refill this medication? Please address

## 2024-05-09 ENCOUNTER — Other Ambulatory Visit: Payer: Self-pay | Admitting: Family

## 2024-06-26 ENCOUNTER — Encounter (HOSPITAL_BASED_OUTPATIENT_CLINIC_OR_DEPARTMENT_OTHER): Payer: Self-pay | Admitting: Family Medicine

## 2024-06-27 NOTE — Telephone Encounter (Signed)
 Attempted to call pt about needing to reschedule appt that is currently scheduled 8/13 but unable to reach. Left pt a detailed VM letting her know that we need to change her appt and told her trying to change it to 8/21 at either 9:50 or 10:50 if either of these times works for her. Told her in VM if had not been able to reach her by end of the day that we would have to cancel her appt.  Routing to Jenny and Kiana as high priority for help on trying to reach pt due to current scheduled appt being tomorrow 8/13 that needs to be rescheduled.

## 2024-06-28 ENCOUNTER — Ambulatory Visit (HOSPITAL_BASED_OUTPATIENT_CLINIC_OR_DEPARTMENT_OTHER): Admitting: Family Medicine

## 2024-07-06 ENCOUNTER — Ambulatory Visit (INDEPENDENT_AMBULATORY_CARE_PROVIDER_SITE_OTHER): Admitting: Family Medicine

## 2024-07-06 ENCOUNTER — Encounter (HOSPITAL_BASED_OUTPATIENT_CLINIC_OR_DEPARTMENT_OTHER): Payer: Self-pay | Admitting: Family Medicine

## 2024-07-06 VITALS — BP 114/77 | HR 94 | Ht 63.0 in | Wt 154.3 lb

## 2024-07-06 DIAGNOSIS — R768 Other specified abnormal immunological findings in serum: Secondary | ICD-10-CM | POA: Diagnosis not present

## 2024-07-06 DIAGNOSIS — H04129 Dry eye syndrome of unspecified lacrimal gland: Secondary | ICD-10-CM | POA: Diagnosis not present

## 2024-07-06 DIAGNOSIS — L84 Corns and callosities: Secondary | ICD-10-CM | POA: Diagnosis not present

## 2024-07-06 DIAGNOSIS — M67431 Ganglion, right wrist: Secondary | ICD-10-CM | POA: Diagnosis not present

## 2024-07-06 NOTE — Patient Instructions (Signed)

## 2024-07-06 NOTE — Assessment & Plan Note (Signed)
 Patient reports issues with calluses on her feet as well as associated hammertoes.  As a result, she is wanting to meet with podiatrist for further evaluation and treatment.  I do feel that this is reasonable, referral placed today.  She does have specific office that she would prefer referral be sent to

## 2024-07-06 NOTE — Assessment & Plan Note (Signed)
 Patient has small area of swelling over volar aspect of right wrist.  She thinks that this may be a small cyst.  Occasionally she will have some discomfort associated with this cyst, but denies experiencing any redness and worsening swelling or other symptoms.  She thinks she may have a small area over her left wrist that is starting which she feels is similar to area on right wrist. On exam, patient does have subcutaneous nodule which is mobile, mild tenderness to palpation, no overlying skin changes, no redness.  No fluctuance noted, area feels very firm. Discussed that observed area is most likely related to ganglion cyst.  We reviewed treatment options including ultrasound-guided aspiration, surgical excision.  For now, patient would prefer to continue with monitoring.  Advised that she can return the office at any time for us  to proceed with procedural intervention if desired.  If she ultimately would like to meet with surgical specialist regarding this, we can place referral at any time

## 2024-07-06 NOTE — Progress Notes (Signed)
    Procedures performed today:    None.  Independent interpretation of notes and tests performed by another provider:   None.  Brief History, Exam, Impression, and Recommendations:    BP 114/77 (BP Location: Right Arm, Patient Position: Sitting, Cuff Size: Normal)   Pulse 94   Ht 5' 3 (1.6 m)   Wt 154 lb 4.8 oz (70 kg)   LMP 11/16/2012   SpO2 100%   BMI 27.33 kg/m   Callus of foot Assessment & Plan: Patient reports issues with calluses on her feet as well as associated hammertoes.  As a result, she is wanting to meet with podiatrist for further evaluation and treatment.  I do feel that this is reasonable, referral placed today.  She does have specific office that she would prefer referral be sent to  Orders: -     Ambulatory referral to Podiatry  SS-B antibody positive -     Ambulatory referral to Rheumatology  Eye dryness Assessment & Plan: Patient reports history of dry eyes as well as prior evaluation with neurology initially which showed positive SSB antibody.  As a result, she did have evaluation with rheumatologist who ultimately advised her that no specific intervention was required.  She is wanting to have second opinion and meet with alternative rheumatologist given history and to ensure that there are no other specific recommendations from a rheumatology standpoint.  Feel that this is reasonable, referral placed today  Orders: -     Ambulatory referral to Rheumatology  Ganglion cyst of volar aspect of right wrist Assessment & Plan: Patient has small area of swelling over volar aspect of right wrist.  She thinks that this may be a small cyst.  Occasionally she will have some discomfort associated with this cyst, but denies experiencing any redness and worsening swelling or other symptoms.  She thinks she may have a small area over her left wrist that is starting which she feels is similar to area on right wrist. On exam, patient does have subcutaneous nodule which  is mobile, mild tenderness to palpation, no overlying skin changes, no redness.  No fluctuance noted, area feels very firm. Discussed that observed area is most likely related to ganglion cyst.  We reviewed treatment options including ultrasound-guided aspiration, surgical excision.  For now, patient would prefer to continue with monitoring.  Advised that she can return the office at any time for us  to proceed with procedural intervention if desired.  If she ultimately would like to meet with surgical specialist regarding this, we can place referral at any time   Return in about 6 months (around 01/06/2025).   ___________________________________________ Evelyn Benison de [place], MD, ABFM, CAQSM Primary Care and Sports Medicine [facility]

## 2024-07-06 NOTE — Assessment & Plan Note (Signed)
 Patient reports history of dry eyes as well as prior evaluation with neurology initially which showed positive SSB antibody.  As a result, she did have evaluation with rheumatologist who ultimately advised her that no specific intervention was required.  She is wanting to have second opinion and meet with alternative rheumatologist given history and to ensure that there are no other specific recommendations from a rheumatology standpoint.  Feel that this is reasonable, referral placed today

## 2024-07-31 DIAGNOSIS — D2371 Other benign neoplasm of skin of right lower limb, including hip: Secondary | ICD-10-CM | POA: Diagnosis not present

## 2024-07-31 DIAGNOSIS — M2011 Hallux valgus (acquired), right foot: Secondary | ICD-10-CM | POA: Diagnosis not present

## 2024-07-31 DIAGNOSIS — M2041 Other hammer toe(s) (acquired), right foot: Secondary | ICD-10-CM | POA: Diagnosis not present

## 2024-08-15 ENCOUNTER — Ambulatory Visit (INDEPENDENT_AMBULATORY_CARE_PROVIDER_SITE_OTHER): Admitting: *Deleted

## 2024-08-15 ENCOUNTER — Encounter (HOSPITAL_BASED_OUTPATIENT_CLINIC_OR_DEPARTMENT_OTHER): Payer: Self-pay

## 2024-08-15 DIAGNOSIS — Z0000 Encounter for general adult medical examination without abnormal findings: Secondary | ICD-10-CM | POA: Diagnosis not present

## 2024-08-15 NOTE — Progress Notes (Signed)
 Subjective:   Evelyn Bright is a 68 y.o. female who presents for Medicare Annual (Subsequent) preventive examination.  Visit Complete: Virtual I connected with  Evelyn Bright on 08/15/24 by a audio enabled telemedicine application and verified that I am speaking with the correct person using two identifiers.  Patient Location: Home  Provider Location: Home Office  I discussed the limitations of evaluation and management by telemedicine. The patient expressed understanding and agreed to proceed.  Vital Signs: Because this visit was a virtual/telehealth visit, some criteria may be missing or patient reported. Any vitals not documented were not able to be obtained and vitals that have been documented are patient reported.  Cardiac Risk Factors include: advanced age (>41men, >50 women)     Objective:    There were no vitals filed for this visit. There is no height or weight on file to calculate BMI.     08/15/2024    3:13 PM 03/03/2023    3:08 AM 03/02/2023   12:28 PM 01/25/2023    5:07 PM  Advanced Directives  Does Patient Have a Medical Advance Directive? No No No No  Would patient like information on creating a medical advance directive? No - Patient declined No - Patient declined No - Patient declined No - Patient declined    Current Medications (verified) Outpatient Encounter Medications as of 08/15/2024  Medication Sig   Cholecalciferol (VITAMIN D3) 50 MCG (2000 UT) TABS Take 1 tablet by mouth daily.   nitroGLYCERIN  (NITROSTAT ) 0.4 MG SL tablet Place 1 tablet (0.4 mg total) under the tongue every 5 (five) minutes as needed for chest pain.   No facility-administered encounter medications on file as of 08/15/2024.    Allergies (verified) Compazine [prochlorperazine], Minocin [minocycline], Naproxen, Prednisone, and Sulfa antibiotics   History: Past Medical History:  Diagnosis Date   Allergy    Arthritis    Atypical chest pain 11/08/2020   Frequency of urination     Osteopenia    Palpitations 11/08/2020   cardiologist-- dr [email or web address], for palpitations/ atypical chest pain;  cardiac work-up results in epic,  nuclear stress test normal , echo normal, zio monitor normal   Pericardial cyst    evaluated by cardiacthoracic surgeon 09-29-2022 note in epic, likely pericardial cyst or thymic cyst, unclear if the that is what is causing pain but symptoms are improving , plan is to return 6 month and repeat MRI   Pure hypercholesterolemia 11/08/2020   cardiac CT 07-28-2022 w/ calcium score = 19 involving LAD   Renal cyst, left    Right ureteral calculus    Wears glasses    Past Surgical History:  Procedure Laterality Date   CHOLECYSTECTOMY  08/12/2006   COLONOSCOPY     CYSTECTOMY Left 2007   paratubal cyst removal   CYSTECTOMY  12/2023   chest   CYSTOSCOPY/URETEROSCOPY/HOLMIUM LASER/STENT PLACEMENT Right 03/02/2023   Procedure: CYSTOSCOPY RIGHT URETEROSCOPY, RIGHT RETROGRADE PYELOGRAM, HOLMIUM LASER LITHOTRIPSY, AND RIGHT URETERAL STENT PLACEMENT;  Surgeon: Evelyn Valli BIRCH, MD;  Location: [facility];  Service: Urology;  Laterality: Right;   Family History  Problem Relation Age of Onset   Diabetes Sister    Heart disease Sister    Obesity Sister    Diabetes Brother    Pulmonary Hypertension Brother    Heart disease Brother    Hypertension Brother    Heart attack Brother 63   Peripheral Artery Disease Brother    Heart disease Brother    Obesity Brother  Heart attack Brother        12 heart attacks and open heart surgery   Diabetes Brother    Heart disease Brother    Heart attack Mother        heart surgery/staph infection   Peripheral Artery Disease Mother    Valvular heart disease Mother    Diabetes Mother    Heart disease Mother    Obesity Mother    Heart attack Sister        open heart surgery   Congestive Heart Failure Father    Stroke Father    Hypertension Father    Heart disease Father    Breast cancer Neg Hx     Social History   Socioeconomic History   Marital status: Widowed    Spouse name: Not on file   Number of children: 2   Years of education: Not on file   Highest education level: Not on file  Occupational History   Not on file  Tobacco Use   Smoking status: Former    Types: Cigarettes    Passive exposure: Never   Smokeless tobacco: Never  Vaping Use   Vaping status: Never Used  Substance and Sexual Activity   Alcohol use: No   Drug use: Never   Sexual activity: Not Currently    Partners: Male    Birth control/protection: Post-menopausal  Other Topics Concern   Not on file  Social History Narrative   Left handed   Caffeine-none   Social Drivers of Health   Financial Resource Strain: Low Risk  (08/15/2024)   Overall Financial Resource Strain (CARDIA)    Difficulty of Paying Living Expenses: Not hard at all  Food Insecurity: No Food Insecurity (08/15/2024)   Hunger Vital Sign    Worried About Running Out of Food in the Last Year: Never true    Ran Out of Food in the Last Year: Never true  Transportation Needs: No Transportation Needs (08/15/2024)   PRAPARE - Administrator, Civil Service (Medical): No    Lack of Transportation (Non-Medical): No  Physical Activity: Insufficiently Active (08/15/2024)   Exercise Vital Sign    Days of Exercise per Week: 1 day    Minutes of Exercise per Session: 10 min  Stress: No Stress Concern Present (08/15/2024)   [employer] of Occupational Health - Occupational Stress Questionnaire    Feeling of Stress: Not at all  Social Connections: Moderately Isolated (08/15/2024)   Social Connection and Isolation Panel    Frequency of Communication with Friends and Family: More than three times a week    Frequency of Social Gatherings with Friends and Family: Once a week    Attends Religious Services: More than 4 times per year    Active Member of [employer] or Organizations: No    Attends Banker Meetings: Never     Marital Status: Widowed    Tobacco Counseling Counseling given: Not Answered   Clinical Intake:  Pre-visit preparation completed: Yes  Pain : No/denies pain     Diabetes: No  How often do you need to have someone help you when you read instructions, pamphlets, or other written materials from your doctor or pharmacy?: 1 - Never  Interpreter Needed?: No  Information entered by :: Mliss Graff LPN   Activities of Daily Living    08/15/2024    3:14 PM  In your present state of health, do you have any difficulty performing the following activities:  Hearing? 0  Vision? 0  Difficulty concentrating or making decisions? 0  Walking or climbing stairs? 0  Dressing or bathing? 0  Doing errands, shopping? 0  Preparing Food and eating ? N  Using the Toilet? N  In the past six months, have you accidently leaked urine? Y  Do you have problems with loss of bowel control? N  Managing your Medications? N  Managing your Finances? N  Housekeeping or managing your Housekeeping? N    Patient Care Team: [place], Quintin PARAS, MD as PCP - General (Family Medicine) Raford Riggs, MD as PCP - Cardiology (Cardiology)  Indicate any recent Medical Services you may have received from other than [facility] providers in the past year (date may be approximate).     Assessment:   This is a routine wellness examination for Tameya.  Hearing/Vision screen Hearing Screening - Comments:: No hearing aids Vision Screening - Comments:: [facility] Up to date   Goals Addressed             This Visit's Progress    Patient Stated       No goals       Depression Screen    08/15/2024    3:19 PM 07/06/2024   10:49 AM 01/17/2024    1:50 PM 06/25/2023   11:30 AM 05/03/2023   10:59 AM 01/27/2023    3:06 PM 09/28/2022    8:45 AM  PHQ 2/9 Scores  PHQ - 2 Score 0 0 0 0 0 0 0  PHQ- 9 Score 1 0  0 6      Fall Risk    08/15/2024    3:13 PM 07/06/2024   10:48 AM 01/17/2024    1:50 PM 06/25/2023    11:30 AM 05/03/2023   10:59 AM  Fall Risk   Falls in the past year? 0 0 0 0 0  Number falls in past yr: 0 0 0 0 0  Injury with Fall? 0 0 0 0 0  Risk for fall due to :  No Fall Risks  No Fall Risks No Fall Risks  Follow up Falls evaluation completed;Education provided;Falls prevention discussed Falls evaluation completed  Falls evaluation completed Falls evaluation completed    MEDICARE RISK AT HOME: Medicare Risk at Home Any stairs in or around the home?: No If so, are there any without handrails?: No Home free of loose throw rugs in walkways, pet beds, electrical cords, etc?: Yes Adequate lighting in your home to reduce risk of falls?: Yes Life alert?: No Use of a cane, walker or w/c?: No Grab bars in the bathroom?: No Shower chair or bench in shower?: No Elevated toilet seat or a handicapped toilet?: No  TIMED UP AND GO:  Was the test performed?  No    Cognitive Function:        08/15/2024    3:15 PM 05/03/2023   11:00 AM  6CIT Screen  What Year? 0 points 0 points  What month? 0 points 0 points  What time? 0 points 0 points  Count back from 20 0 points 0 points  Months in reverse 0 points 0 points  Repeat phrase 0 points 0 points  Total Score 0 points 0 points    Immunizations  There is no immunization history on file for this patient.  TDAP status: Due, Education has been provided regarding the importance of this vaccine. Advised may receive this vaccine at local pharmacy or Health Dept. Aware to provide a copy of the vaccination record  if obtained from local pharmacy or Health Dept. Verbalized acceptance and understanding.  Flu Vaccine status: Declined, Education has been provided regarding the importance of this vaccine but patient still declined. Advised may receive this vaccine at local pharmacy or Health Dept. Aware to provide a copy of the vaccination record if obtained from local pharmacy or Health Dept. Verbalized acceptance and understanding.  Pneumococcal  vaccine status: Declined,  Education has been provided regarding the importance of this vaccine but patient still declined. Advised may receive this vaccine at local pharmacy or Health Dept. Aware to provide a copy of the vaccination record if obtained from local pharmacy or Health Dept. Verbalized acceptance and understanding.   Covid-19 vaccine status: Declined, Education has been provided regarding the importance of this vaccine but patient still declined. Advised may receive this vaccine at local pharmacy or Health Dept.or vaccine clinic. Aware to provide a copy of the vaccination record if obtained from local pharmacy or Health Dept. Verbalized acceptance and understanding.  Qualifies for Shingles Vaccine? Yes   Zostavax completed No   Shingrix Completed?: No.    Education has been provided regarding the importance of this vaccine. Patient has been advised to call insurance company to determine out of pocket expense if they have not yet received this vaccine. Advised may also receive vaccine at local pharmacy or Health Dept. Verbalized acceptance and understanding.  Screening Tests Health Maintenance  Topic Date Due   Zoster Vaccines- Shingrix (1 of 2) Never done   DTaP/Tdap/Td (1 - Tdap) 01/16/2025 (Originally 06/03/1975)   Pneumococcal Vaccine: 50+ Years (1 of 2 - PCV) 01/16/2025 (Originally 06/03/1975)   Influenza Vaccine  02/13/2025 (Originally 06/16/2024)   Medicare Annual Wellness (AWV)  08/15/2025   Mammogram  10/05/2025   Fecal DNA (Cologuard)  02/16/2027   DEXA SCAN  Completed   Hepatitis C Screening  Completed   HPV VACCINES  Aged Out   Meningococcal B Vaccine  Aged Out   COVID-19 Vaccine  Discontinued    Health Maintenance  Health Maintenance Due  Topic Date Due   Zoster Vaccines- Shingrix (1 of 2) Never done    Colorectal cancer screening: Type of screening: Colonoscopy. Completed 2025. Repeat every   years  Mammogram status: Completed  . Repeat every year  Bone  Density status: Completed 2022. Results reflect: Bone density results: OSTEOPENIA. Repeat every 3 years.  Lung Cancer Screening: (Low Dose CT Chest recommended if Age 78-80 years, 20 pack-year currently smoking OR have quit w/in 15years.) does not qualify.   Lung Cancer Screening Referral:   Additional Screening:  Hepatitis C Screening: does not qualify; Completed 2018  Vision Screening: Recommended annual ophthalmology exams for early detection of glaucoma and other disorders of the eye. Is the patient up to date with their annual eye exam?  Yes  Who is the provider or what is the name of the office in which the patient attends annual eye exams? [facility] If pt is not established with a provider, would they like to be referred to a provider to establish care? No .   Dental Screening: Recommended annual dental exams for proper oral hygiene    Community Resource Referral / Chronic Care Management: CRR required this visit?  No   CCM required this visit?  No     Plan:     I have personally reviewed and noted the following in the patient's chart:   Medical and social history Use of alcohol, tobacco or illicit drugs  Current medications and supplements  including opioid prescriptions. Patient is not currently taking opioid prescriptions. Functional ability and status Nutritional status Physical activity Advanced directives List of other physicians Hospitalizations, surgeries, and ER visits in previous 12 months Vitals Screenings to include cognitive, depression, and falls Referrals and appointments  In addition, I have reviewed and discussed with patient certain preventive protocols, quality metrics, and best practice recommendations. A written personalized care plan for preventive services as well as general preventive health recommendations were provided to patient.     Mliss Graff, LPN   [id number]   After Visit Summary: (MyChart) Due to this being a telephonic visit, the  after visit summary with patients personalized plan was offered to patient via MyChart   Nurse Notes:

## 2024-08-15 NOTE — Patient Instructions (Signed)
 Evelyn Bright , Thank you for taking time to come for your Medicare Wellness Visit. I appreciate your ongoing commitment to your health goals. Please review the following plan we discussed and let me know if I can assist you in the future.   Screening recommendations/referrals: Colonoscopy:  Mammogram:  Bone Density:  Recommended yearly ophthalmology/optometry visit for glaucoma screening and checkup Recommended yearly dental visit for hygiene and checkup  Vaccinations: Influenza vaccine:  Pneumococcal vaccine:  Tdap vaccine:  Shingles vaccine:        Preventive Care 65 Years and Older, Female Preventive care refers to lifestyle choices and visits with your health care provider that can promote health and wellness. What does preventive care include? A yearly physical exam. This is also called an annual well check. Dental exams once or twice a year. Routine eye exams. Ask your health care provider how often you should have your eyes checked. Personal lifestyle choices, including: Daily care of your teeth and gums. Regular physical activity. Eating a healthy diet. Avoiding tobacco and drug use. Limiting alcohol use. Practicing safe sex. Taking low-dose aspirin  every day. Taking vitamin and mineral supplements as recommended by your health care provider. What happens during an annual well check? The services and screenings done by your health care provider during your annual well check will depend on your age, overall health, lifestyle risk factors, and family history of disease. Counseling  Your health care provider may ask you questions about your: Alcohol use. Tobacco use. Drug use. Emotional well-being. Home and relationship well-being. Sexual activity. Eating habits. History of falls. Memory and ability to understand (cognition). Work and work Astronomer. Reproductive health. Screening  You may have the following tests or measurements: Height, weight, and  BMI. Blood pressure. Lipid and cholesterol levels. These may be checked every 5 years, or more frequently if you are over 46 years old. Skin check. Lung cancer screening. You may have this screening every year starting at age 78 if you have a 30-pack-year history of smoking and currently smoke or have quit within the past 15 years. Fecal occult blood test (FOBT) of the stool. You may have this test every year starting at age 35. Flexible sigmoidoscopy or colonoscopy. You may have a sigmoidoscopy every 5 years or a colonoscopy every 10 years starting at age 69. Hepatitis C blood test. Hepatitis B blood test. Sexually transmitted disease (STD) testing. Diabetes screening. This is done by checking your blood sugar (glucose) after you have not eaten for a while (fasting). You may have this done every 1-3 years. Bone density scan. This is done to screen for osteoporosis. You may have this done starting at age 28. Mammogram. This may be done every 1-2 years. Talk to your health care provider about how often you should have regular mammograms. Talk with your health care provider about your test results, treatment options, and if necessary, the need for more tests. Vaccines  Your health care provider may recommend certain vaccines, such as: Influenza vaccine. This is recommended every year. Tetanus, diphtheria, and acellular pertussis (Tdap, Td) vaccine. You may need a Td booster every 10 years. Zoster vaccine. You may need this after age 4. Pneumococcal 13-valent conjugate (PCV13) vaccine. One dose is recommended after age 75. Pneumococcal polysaccharide (PPSV23) vaccine. One dose is recommended after age 40. Talk to your health care provider about which screenings and vaccines you need and how often you need them. This information is not intended to replace advice given to you by your health  care provider. Make sure you discuss any questions you have with your health care provider. Document  Released: 11/29/2015 Document Revised: 07/22/2016 Document Reviewed: 09/03/2015 Elsevier Interactive Patient Education  2017 [employer].  Fall Prevention in the Home Falls can cause injuries. They can happen to people of all ages. There are many things you can do to make your home safe and to help prevent falls. What can I do on the outside of my home? Regularly fix the edges of walkways and driveways and fix any cracks. Remove anything that might make you trip as you walk through a door, such as a raised step or threshold. Trim any bushes or trees on the path to your home. Use bright outdoor lighting. Clear any walking paths of anything that might make someone trip, such as rocks or tools. Regularly check to see if handrails are loose or broken. Make sure that both sides of any steps have handrails. Any raised decks and porches should have guardrails on the edges. Have any leaves, snow, or ice cleared regularly. Use sand or salt on walking paths during winter. Clean up any spills in your garage right away. This includes oil or grease spills. What can I do in the bathroom? Use night lights. Install grab bars by the toilet and in the tub and shower. Do not use towel bars as grab bars. Use non-skid mats or decals in the tub or shower. If you need to sit down in the shower, use a plastic, non-slip stool. Keep the floor dry. Clean up any water that spills on the floor as soon as it happens. Remove soap buildup in the tub or shower regularly. Attach bath mats securely with double-sided non-slip rug tape. Do not have throw rugs and other things on the floor that can make you trip. What can I do in the bedroom? Use night lights. Make sure that you have a light by your bed that is easy to reach. Do not use any sheets or blankets that are too big for your bed. They should not hang down onto the floor. Have a firm chair that has side arms. You can use this for support while you get dressed. Do  not have throw rugs and other things on the floor that can make you trip. What can I do in the kitchen? Clean up any spills right away. Avoid walking on wet floors. Keep items that you use a lot in easy-to-reach places. If you need to reach something above you, use a strong step stool that has a grab bar. Keep electrical cords out of the way. Do not use floor polish or wax that makes floors slippery. If you must use wax, use non-skid floor wax. Do not have throw rugs and other things on the floor that can make you trip. What can I do with my stairs? Do not leave any items on the stairs. Make sure that there are handrails on both sides of the stairs and use them. Fix handrails that are broken or loose. Make sure that handrails are as long as the stairways. Check any carpeting to make sure that it is firmly attached to the stairs. Fix any carpet that is loose or worn. Avoid having throw rugs at the top or bottom of the stairs. If you do have throw rugs, attach them to the floor with carpet tape. Make sure that you have a light switch at the top of the stairs and the bottom of the stairs. If you do not  have them, ask someone to add them for you. What else can I do to help prevent falls? Wear shoes that: Do not have high heels. Have rubber bottoms. Are comfortable and fit you well. Are closed at the toe. Do not wear sandals. If you use a stepladder: Make sure that it is fully opened. Do not climb a closed stepladder. Make sure that both sides of the stepladder are locked into place. Ask someone to hold it for you, if possible. Clearly mark and make sure that you can see: Any grab bars or handrails. First and last steps. Where the edge of each step is. Use tools that help you move around (mobility aids) if they are needed. These include: Canes. Walkers. Scooters. Crutches. Turn on the lights when you go into a dark area. Replace any light bulbs as soon as they burn out. Set up your  furniture so you have a clear path. Avoid moving your furniture around. If any of your floors are uneven, fix them. If there are any pets around you, be aware of where they are. Review your medicines with your doctor. Some medicines can make you feel dizzy. This can increase your chance of falling. Ask your doctor what other things that you can do to help prevent falls. This information is not intended to replace advice given to you by your health care provider. Make sure you discuss any questions you have with your health care provider. Document Released: 08/29/2009 Document Revised: 04/09/2016 Document Reviewed: 12/07/2014 Elsevier Interactive Patient Education  2017 [employer].

## 2024-09-28 DIAGNOSIS — R202 Paresthesia of skin: Secondary | ICD-10-CM | POA: Diagnosis not present

## 2024-09-28 DIAGNOSIS — H04129 Dry eye syndrome of unspecified lacrimal gland: Secondary | ICD-10-CM | POA: Diagnosis not present

## 2024-09-28 DIAGNOSIS — M25649 Stiffness of unspecified hand, not elsewhere classified: Secondary | ICD-10-CM | POA: Diagnosis not present

## 2024-09-28 DIAGNOSIS — Z6829 Body mass index (BMI) 29.0-29.9, adult: Secondary | ICD-10-CM | POA: Diagnosis not present

## 2024-09-28 DIAGNOSIS — E663 Overweight: Secondary | ICD-10-CM | POA: Diagnosis not present

## 2024-09-29 ENCOUNTER — Ambulatory Visit (HOSPITAL_BASED_OUTPATIENT_CLINIC_OR_DEPARTMENT_OTHER): Payer: Medicare HMO | Admitting: Obstetrics & Gynecology

## 2024-10-05 DIAGNOSIS — H04123 Dry eye syndrome of bilateral lacrimal glands: Secondary | ICD-10-CM | POA: Diagnosis not present

## 2024-10-05 DIAGNOSIS — H18512 Endothelial corneal dystrophy, left eye: Secondary | ICD-10-CM | POA: Diagnosis not present

## 2024-10-05 DIAGNOSIS — H43393 Other vitreous opacities, bilateral: Secondary | ICD-10-CM | POA: Diagnosis not present

## 2024-10-05 DIAGNOSIS — H25813 Combined forms of age-related cataract, bilateral: Secondary | ICD-10-CM | POA: Diagnosis not present

## 2024-11-20 ENCOUNTER — Telehealth: Payer: Self-pay | Admitting: Cardiovascular Disease

## 2024-11-20 ENCOUNTER — Encounter (HOSPITAL_BASED_OUTPATIENT_CLINIC_OR_DEPARTMENT_OTHER): Payer: Self-pay | Admitting: Cardiovascular Disease

## 2024-11-20 NOTE — Telephone Encounter (Signed)
 Pt c/o swelling/edema: STAT if pt has developed SOB within 24 hours  If swelling, where is the swelling located? Right leg swelling around knee and calf.   How much weight have you gained and in what time span? no  Have you gained 2 pounds in a day or 5 pounds in a week? no  Do you have a log of your daily weights (if so, list)?   Are you currently taking a fluid pill? no  Are you currently SOB? Every once in a while.   Have you traveled recently in a car or plane for an extended period of time? No.   She is not sure if she should see us  or her primary care or rheumatology.  She said she did sent a mychart message as well.

## 2024-11-20 NOTE — Telephone Encounter (Signed)
 Returned a call back to the pt about complaints.   Pt is calling in with complaints of right leg swelling around the circumference of her right knee with mild swelling into the calf. Pt states the swelling goes around the entire right knee.  She reports no swelling to the right foot or ankle area.  Pt reports it's sore when she walks or rest.   She denies cramp-like or throbbing pain to the right knee, and more so describes it as a dull pain. She states it is sore to touch in that area.  Pt denies right calf pain when she dorsiflexes the foot, as well as denies obvious redness or warmth to that area.  She also confirmed no discoloration to the right lower extremity.   Pt states she has not had any recent trauma or fall to that extremity, and swelling is unexplained. She states she is able to ambulate throughout the house, but is favoring her left leg more, with the right knee swelling/pain.  She is elevating the extremity at rest.   Pt reports having a history of Sjogren's disease and is followed by Rheumatology for this.  She also mentioned having diagnosis of spider veins to both extremities, which can be painful at times.   She denies history of DVT.  Pt denies any chest pain, sob, doe, or acute weight gain at this time.  She denies orthopnea.  Pt mentioned having intermittent chest pressure a couple weeks ago, and associates this as normal findings with Sjorgen's disease.  Again, she confirmed no current chest pain or sob at this time.  Pt did call Rheumatology today about complaints, and they advised her to contact Cardiology.   Went ahead and scheduled the pt to come into the office to see our DOD Dr. Lonni tomorrow 1/6 at 2:40 pm.  ED precautions provided to the pt in the interim between now and her appt with our office tomorrow.  Pt education provided.  Did advise the pt to avoid rubbing or massaging the area, and continue elevating at rest.   Pt is aware I will share this plan with Dr.  Raford and her nurse.   Pt verbalized understanding and agrees with this plan.

## 2024-11-20 NOTE — Telephone Encounter (Signed)
 See telephone note from today for further details and plan.

## 2024-11-21 ENCOUNTER — Ambulatory Visit (HOSPITAL_BASED_OUTPATIENT_CLINIC_OR_DEPARTMENT_OTHER): Admitting: Cardiology

## 2024-11-21 ENCOUNTER — Encounter (HOSPITAL_BASED_OUTPATIENT_CLINIC_OR_DEPARTMENT_OTHER): Payer: Self-pay | Admitting: Cardiology

## 2024-11-21 VITALS — BP 122/72 | HR 100 | Ht 63.0 in | Wt 166.0 lb

## 2024-11-21 DIAGNOSIS — M25561 Pain in right knee: Secondary | ICD-10-CM

## 2024-11-21 DIAGNOSIS — M3500 Sicca syndrome, unspecified: Secondary | ICD-10-CM | POA: Diagnosis not present

## 2024-11-21 DIAGNOSIS — G8929 Other chronic pain: Secondary | ICD-10-CM

## 2024-11-21 DIAGNOSIS — R0602 Shortness of breath: Secondary | ICD-10-CM

## 2024-11-21 DIAGNOSIS — M25461 Effusion, right knee: Secondary | ICD-10-CM

## 2024-11-21 DIAGNOSIS — R079 Chest pain, unspecified: Secondary | ICD-10-CM | POA: Diagnosis not present

## 2024-11-21 NOTE — Patient Instructions (Signed)
 Medication Instructions:  No changes *If you need a refill on your cardiac medications before your next appointment, please call your pharmacy*  Lab Work: none  Testing/Procedures: Your physician has requested that you have a lower extremity venous duplex. This test is an ultrasound of the veins in the legs. It looks at venous blood flow that carries blood from the heart to the legs. Allow one hour for a Lower Venous exam.  There are no restrictions or special instructions.  Please note: We ask at that you not bring children with you during ultrasound (echo/ vascular) testing. Due to room size and safety concerns, children are not allowed in the ultrasound rooms during exams. Our front office staff cannot provide observation of children in our lobby area while testing is being conducted. An adult accompanying a patient to their appointment will only be allowed in the ultrasound room at the discretion of the ultrasound technician under special circumstances. We apologize for any inconvenience.   Follow-Up: As planned

## 2024-11-21 NOTE — Progress Notes (Signed)
 " Cardiology Office Note:  .   Date:  11/21/2024  ID:  Evelyn Bright, DOB [birth year], MRN [id number] PCP: de Bright, Evelyn J, MD  [facility] Providers Cardiologist:  Annabella Scarce, MD {  History of Present Illness: .   Evelyn Bright is a 69 y.o. female with PMH pericardial cyst, hyperlipidemia, palpitations, chronic chest pain.  Today: Patient is seen as an urgent doctor of the day visit for leg swelling and pain. Phone note reviewed, noted right leg swelling around right knee extending into the calf. No swelling at right foot or ankle. Sore to the touch. No pain with flexion/extension of foot. No discoloration. No recent trauma. She has a history of Sjogren's and is followed by rheumatology, who recommended she contact cardiology. No history of prior DVT. Does note history of spider veins, and Dr. Skeeter last note noted likely component of venous insufficiency.  Brings a list today. Notes since about a week ago, she has had swelling in her right knee. No erythema or redness, feels shooting pain up and down inside her leg. No tenderness to calf on exam. Swelling is better today than it has been. No recent travel or injury. Does hurt with certain movements in her knee. Has had intermittent ankle medial swelling but no main calf swelling.   ROS body aches, tired, no energy. Vision changes intermittently, has seen an eye doctor. Feels like she is urinating more than usual.  Feels intermittent shortness of breath/chest pressure chronically, not acutely changed in the last week.  ROS: Denies chest pain, shortness of breath at rest or with normal exertion. No PND, orthopnea, or unexpected weight gain. No syncope or palpitations. ROS otherwise negative except as noted.   Studies Reviewed: SABRA    EKG:  EKG Interpretation Date/Time:  Tuesday November 21 2024 14:58:08 EST Ventricular Rate:  100 PR Interval:  140 QRS Duration:  66 QT Interval:  334 QTC Calculation: 430 R  Axis:   34  Text Interpretation: Normal sinus rhythm Normal ECG When compared with ECG of 25-Apr-2024 08:03, No significant change was found Confirmed by Lonni Slain [phone]) on 11/21/2024 3:29:00 PM    Physical Exam:   VS:  BP 122/72   Pulse 100   Ht 5' 3 (1.6 m)   Wt 166 lb (75.3 kg)   LMP 11/16/2012   SpO2 96%   BMI 29.41 kg/m    Wt Readings from Last 3 Encounters:  11/21/24 166 lb (75.3 kg)  07/06/24 154 lb 4.8 oz (70 kg)  05/02/24 156 lb 14.4 oz (71.2 kg)    GEN: Well nourished, well developed in no acute distress HEENT: Normal, moist mucous membranes NECK: No JVD CARDIAC: regular rhythm, normal S1 and S2, no rubs or gallops. No murmur. VASCULAR: Radial and DP pulses 2+ bilaterally. No carotid bruits RESPIRATORY:  Clear to auscultation without rales, wheezing or rhonchi  ABDOMEN: Soft, non-tender, non-distended MUSCULOSKELETAL:  Ambulates independently SKIN: Warm and dry, no edema. No tenderness, warmth, redness noted in either lower extremity on exam NEUROLOGIC:  Alert and oriented x 3. No focal neuro deficits noted. PSYCHIATRIC:  Normal affect    ASSESSMENT AND PLAN: .    Right knee pain/swelling Sjogren's syndrome -seen as urgent visit, will rule out DVT -if no DVT, recommended she follow up with her PCP and/or rheumatology for further evaluation  UPDATED TO ADD: ultrasound without DVT or cyst  Chronic chest pain/shortness of breath -not acutely worse -reassuring prior workup -reviewed red flags that  need immediate medical attention   Dispo: as previously scheduled with Dr. Raford  Signed, Shelda Bruckner, MD   Shelda Bruckner, MD, PhD, [facility]  [facility]  [place]  [facility] at [facility] [address] [place], [place] [address] [phone]   "

## 2024-11-22 ENCOUNTER — Ambulatory Visit (HOSPITAL_COMMUNITY)
Admission: RE | Admit: 2024-11-22 | Discharge: 2024-11-22 | Disposition: A | Discharge status: Home / Self Care | Source: Ambulatory Visit | Attending: Cardiology | Admitting: Cardiology

## 2024-11-22 DIAGNOSIS — M25561 Pain in right knee: Secondary | ICD-10-CM | POA: Diagnosis present

## 2024-11-22 DIAGNOSIS — M25461 Effusion, right knee: Secondary | ICD-10-CM | POA: Diagnosis present

## 2024-11-23 ENCOUNTER — Ambulatory Visit (HOSPITAL_BASED_OUTPATIENT_CLINIC_OR_DEPARTMENT_OTHER): Payer: Self-pay | Admitting: Cardiology

## 2024-11-27 ENCOUNTER — Telehealth (HOSPITAL_BASED_OUTPATIENT_CLINIC_OR_DEPARTMENT_OTHER): Payer: Self-pay | Admitting: Family Medicine

## 2024-11-27 NOTE — Telephone Encounter (Signed)
 Patient called stating she has called in multiple times needing a call back about pain and swelling in legs please call patient back about concerns.  She stated she keeps calling and I do not see any encounters please advise

## 2024-11-28 NOTE — Telephone Encounter (Signed)
 I informed patient that she will need to come in for evaluation. Patient is concerned that she will not be able to get an appointment this week due to availability.

## 2024-11-28 NOTE — Telephone Encounter (Signed)
 Leg and knee pain started 2 weeks ago. Patient does not want to come into office due to fear of Flu exposure. She would like to know what her options are.

## 2024-12-12 ENCOUNTER — Ambulatory Visit (HOSPITAL_BASED_OUTPATIENT_CLINIC_OR_DEPARTMENT_OTHER): Admitting: Family Medicine

## 2024-12-21 NOTE — Progress Notes (Unsigned)
 "  Office Visit Note  Patient: Evelyn Bright             Date of Birth: [birth year]           MRN: [id number]             PCP: de Cuba, Raymond J, MD Referring: de Cuba, Raymond J, MD Visit Date: 01/04/2025 Occupation: Data Unavailable  Subjective:  No chief complaint on file.   History of Present Illness: Evelyn Bright is a 69 y.o. female ***     Activities of Daily Living:  Patient reports morning stiffness for *** {minute/hour:19697}.   Patient {ACTIONS;DENIES/REPORTS:21021675::Denies} nocturnal pain.  Difficulty dressing/grooming: {ACTIONS;DENIES/REPORTS:21021675::Denies} Difficulty climbing stairs: {ACTIONS;DENIES/REPORTS:21021675::Denies} Difficulty getting out of chair: {ACTIONS;DENIES/REPORTS:21021675::Denies} Difficulty using hands for taps, buttons, cutlery, and/or writing: {ACTIONS;DENIES/REPORTS:21021675::Denies}  No Rheumatology ROS completed.   PMFS History:  Patient Active Problem List   Diagnosis Date Noted   Callus of foot 07/06/2024   Ganglion cyst of volar aspect of right wrist 07/06/2024   Eye dryness 12/15/2023   Numbness and tingling 05/10/2023   Hyperreflexia 05/10/2023   Pressure in head 05/10/2023   Frequency of urination 03/31/2023   Unable to empty bladder 03/31/2023   Jittery 03/31/2023   Cherry hemangioma 03/31/2023   Pericardial cyst 03/17/2023   Microscopic hematuria 09/28/2022   CAD (coronary artery disease) 08/31/2022   HLD (hyperlipidemia) 08/31/2022   Palpitations 11/08/2020   Atypical chest pain 11/08/2020   Pure hypercholesterolemia 11/08/2020   Intramural uterine fibroid 03/27/2018    Past Medical History:  Diagnosis Date   Allergy    Arthritis    Atypical chest pain 11/08/2020   Frequency of urination    Osteopenia    Palpitations 11/08/2020   cardiologist-- dr oneida. raford, for palpitations/ atypical chest pain;  cardiac work-up results in epic,  nuclear stress test normal , echo normal, zio monitor normal    Pericardial cyst    evaluated by cardiacthoracic surgeon 09-29-2022 note in epic, likely pericardial cyst or thymic cyst, unclear if the that is what is causing pain but symptoms are improving , plan is to return 6 month and repeat MRI   Pure hypercholesterolemia 11/08/2020   cardiac CT 07-28-2022 w/ calcium score = 19 involving LAD   Renal cyst, left    Right ureteral calculus    Wears glasses     Family History  Problem Relation Age of Onset   Diabetes Sister    Heart disease Sister    Obesity Sister    Diabetes Brother    Pulmonary Hypertension Brother    Heart disease Brother    Hypertension Brother    Heart attack Brother 75   Peripheral Artery Disease Brother    Heart disease Brother    Obesity Brother    Heart attack Brother        12 heart attacks and open heart surgery   Diabetes Brother    Heart disease Brother    Heart attack Mother        heart surgery/staph infection   Peripheral Artery Disease Mother    Valvular heart disease Mother    Diabetes Mother    Heart disease Mother    Obesity Mother    Heart attack Sister        open heart surgery   Congestive Heart Failure Father    Stroke Father    Hypertension Father    Heart disease Father    Breast cancer Neg Hx    Past Surgical  History:  Procedure Laterality Date   CHOLECYSTECTOMY  08/12/2006   COLONOSCOPY     CYSTECTOMY Left 2007   paratubal cyst removal   CYSTECTOMY  12/2023   chest   CYSTOSCOPY/URETEROSCOPY/HOLMIUM LASER/STENT PLACEMENT Right 03/02/2023   Procedure: CYSTOSCOPY RIGHT URETEROSCOPY, RIGHT RETROGRADE PYELOGRAM, HOLMIUM LASER LITHOTRIPSY, AND RIGHT URETERAL STENT PLACEMENT;  Surgeon: Elisabeth Valli BIRCH, MD;  Location: [facility];  Service: Urology;  Laterality: Right;   Social History[1] Social History   Social History Narrative   Left handed   Caffeine-none      There is no immunization history on file for this patient.   Objective: Vital Signs: LMP 11/16/2012     Physical Exam   Musculoskeletal Exam: ***  CDAI Exam: CDAI Score: -- Patient Global: --; Provider Global: -- Swollen: --; Tender: -- Joint Exam 01/04/2025   No joint exam has been documented for this visit   There is currently no information documented on the homunculus. Go to the Rheumatology activity and complete the homunculus joint exam.  Investigation: No additional findings.  Imaging: VAS US  LOWER EXTREMITY VENOUS (DVT) Result Date: 11/22/2024  Lower Venous DVT Study Patient Name:  Evelyn Bright  Date of Exam:   11/22/2024 Medical Rec #: [id number]       Accession #:    [id number] Date of Birth: 1956-02-20       Patient Gender: F Patient Age:   56 years Exam Location:  [address] Procedure:      VAS US  LOWER EXTREMITY VENOUS (DVT) Referring Phys: BRIDGETTE CHRISTOPHER --------------------------------------------------------------------------------  Indications: Right leg pain one week.  Performing Technologist: Geni Lodge RVS, RCS  Examination Guidelines: A complete evaluation includes B-mode imaging, spectral Doppler, color Doppler, and power Doppler as needed of all accessible portions of each vessel. Bilateral testing is considered an integral part of a complete examination. Limited examinations for reoccurring indications may be performed as noted. The reflux portion of the exam is performed with the patient in reverse Trendelenburg.  +---------+---------------+---------+-----------+----------+--------------+ RIGHT    CompressibilityPhasicitySpontaneityPropertiesThrombus Aging +---------+---------------+---------+-----------+----------+--------------+ CFV      Full                                                        +---------+---------------+---------+-----------+----------+--------------+ SFJ      Full                                                        +---------+---------------+---------+-----------+----------+--------------+ FV Prox  Full                                                         +---------+---------------+---------+-----------+----------+--------------+ FV Mid   Full                                                        +---------+---------------+---------+-----------+----------+--------------+  FV DistalFull                                                        +---------+---------------+---------+-----------+----------+--------------+ POP      Full                                                        +---------+---------------+---------+-----------+----------+--------------+ PTV      Full                                                        +---------+---------------+---------+-----------+----------+--------------+ PERO     Full                                                        +---------+---------------+---------+-----------+----------+--------------+ GSV      Full                                                        +---------+---------------+---------+-----------+----------+--------------+     Summary: RIGHT: - There is no evidence of deep vein thrombosis in the lower extremity. - There is no evidence of superficial venous thrombosis.  - No cystic structure found in the popliteal fossa.  LEFT: - No evidence of common femoral vein obstruction.   *See table(s) above for measurements and observations. Electronically signed by Penne Colorado MD on 11/22/2024 at 4:45:03 PM.    Final     Recent Labs: Lab Results  Component Value Date   WBC 6.4 03/30/2023   HGB 14.9 03/30/2023   PLT 209 03/30/2023   NA 143 04/25/2024   K 4.4 04/25/2024   CL 105 04/25/2024   CO2 22 04/25/2024   GLUCOSE 90 04/25/2024   BUN 16 04/25/2024   CREATININE 0.74 04/25/2024   BILITOT 0.5 04/25/2024   ALKPHOS 85 04/25/2024   AST 16 04/25/2024   ALT 15 04/25/2024   PROT 7.2 04/25/2024   ALBUMIN 4.6 04/25/2024   CALCIUM 9.7 04/25/2024   GFRAA 88 09/13/2020   May 10, 2023 SSA negative, SSB  antibody 1.5  Speciality Comments: No specialty comments available.  Procedures:  No procedures performed Allergies: Compazine [prochlorperazine], Minocin [minocycline], Naproxen, Prednisone, and Sulfa antibiotics   Assessment / Plan:     Visit Diagnoses: Dry eyes - SSB antibody positive  Ganglion cyst of volar aspect of right wrist  Callus of foot  Coronary artery disease of native artery of native heart with stable angina pectoris  Mixed hyperlipidemia  Microscopic hematuria  Intramural uterine fibroid  Orders: No orders of the defined types were placed in this encounter.  No orders of the defined types were placed in this encounter.   Face-to-face time  spent with patient was *** minutes. Greater than 50% of time was spent in counseling and coordination of care.  Follow-Up Instructions: No follow-ups on file.   Maya Nash, MD  Note - This record has been created using Animal nutritionist.  Chart creation errors have been sought, but may not always  have been located. Such creation errors do not reflect on  the standard of medical care.    [1]  Social History Tobacco Use   Smoking status: Former    Types: Cigarettes    Passive exposure: Never   Smokeless tobacco: Never  Vaping Use   Vaping status: Never Used  Substance Use Topics   Alcohol use: No   Drug use: Never   "

## 2025-01-04 ENCOUNTER — Encounter: Admitting: Rheumatology

## 2025-01-04 DIAGNOSIS — D251 Intramural leiomyoma of uterus: Secondary | ICD-10-CM

## 2025-01-04 DIAGNOSIS — R3129 Other microscopic hematuria: Secondary | ICD-10-CM

## 2025-01-04 DIAGNOSIS — M67431 Ganglion, right wrist: Secondary | ICD-10-CM

## 2025-01-04 DIAGNOSIS — U071 COVID-19: Secondary | ICD-10-CM

## 2025-01-04 DIAGNOSIS — E782 Mixed hyperlipidemia: Secondary | ICD-10-CM

## 2025-01-04 DIAGNOSIS — K146 Glossodynia: Secondary | ICD-10-CM

## 2025-01-04 DIAGNOSIS — L84 Corns and callosities: Secondary | ICD-10-CM

## 2025-01-04 DIAGNOSIS — I25118 Atherosclerotic heart disease of native coronary artery with other forms of angina pectoris: Secondary | ICD-10-CM

## 2025-01-04 DIAGNOSIS — H04123 Dry eye syndrome of bilateral lacrimal glands: Secondary | ICD-10-CM

## 2025-01-08 ENCOUNTER — Ambulatory Visit (HOSPITAL_BASED_OUTPATIENT_CLINIC_OR_DEPARTMENT_OTHER): Admitting: Family Medicine

## 2025-01-18 ENCOUNTER — Ambulatory Visit: Admitting: Dermatology

## 2025-01-22 ENCOUNTER — Ambulatory Visit (HOSPITAL_BASED_OUTPATIENT_CLINIC_OR_DEPARTMENT_OTHER): Admitting: Obstetrics & Gynecology

## 2025-01-26 ENCOUNTER — Ambulatory Visit: Admitting: Rheumatology

## 2025-01-30 ENCOUNTER — Ambulatory Visit: Admitting: Rheumatology

## 2025-09-11 ENCOUNTER — Ambulatory Visit (HOSPITAL_BASED_OUTPATIENT_CLINIC_OR_DEPARTMENT_OTHER)
